# Patient Record
Sex: Male | Born: 1955 | Race: White | Hispanic: No | Marital: Married | Smoking: Never smoker
Health system: Southern US, Community
[De-identification: ages and names within clinical notes are randomized; demographics above are authoritative.]

## PROBLEM LIST (undated history)

## (undated) DIAGNOSIS — I771 Stricture of artery: Secondary | ICD-10-CM

## (undated) DIAGNOSIS — D72829 Elevated white blood cell count, unspecified: Secondary | ICD-10-CM

## (undated) DIAGNOSIS — R739 Hyperglycemia, unspecified: Secondary | ICD-10-CM

## (undated) DIAGNOSIS — M1990 Unspecified osteoarthritis, unspecified site: Secondary | ICD-10-CM

## (undated) DIAGNOSIS — E669 Obesity, unspecified: Secondary | ICD-10-CM

## (undated) DIAGNOSIS — N289 Disorder of kidney and ureter, unspecified: Secondary | ICD-10-CM

## (undated) DIAGNOSIS — R7989 Other specified abnormal findings of blood chemistry: Secondary | ICD-10-CM

## (undated) DIAGNOSIS — I10 Essential (primary) hypertension: Secondary | ICD-10-CM

## (undated) DIAGNOSIS — I219 Acute myocardial infarction, unspecified: Secondary | ICD-10-CM

## (undated) DIAGNOSIS — I499 Cardiac arrhythmia, unspecified: Secondary | ICD-10-CM

## (undated) DIAGNOSIS — E785 Hyperlipidemia, unspecified: Secondary | ICD-10-CM

## (undated) DIAGNOSIS — I2510 Atherosclerotic heart disease of native coronary artery without angina pectoris: Secondary | ICD-10-CM

## (undated) DIAGNOSIS — I779 Disorder of arteries and arterioles, unspecified: Secondary | ICD-10-CM

## (undated) HISTORY — DX: Stricture of artery: I77.1

## (undated) HISTORY — DX: Disorder of arteries and arterioles, unspecified: I77.9

## (undated) HISTORY — DX: Elevated white blood cell count, unspecified: D72.829

## (undated) HISTORY — DX: Disorder of kidney and ureter, unspecified: N28.9

## (undated) HISTORY — DX: Essential (primary) hypertension: I10

---

## 1998-02-11 ENCOUNTER — Emergency Department (HOSPITAL_COMMUNITY): Admission: EM | Admit: 1998-02-11 | Discharge: 1998-02-11 | Payer: Self-pay | Admitting: Emergency Medicine

## 2014-11-05 ENCOUNTER — Ambulatory Visit (INDEPENDENT_AMBULATORY_CARE_PROVIDER_SITE_OTHER): Payer: 59 | Admitting: Family Medicine

## 2014-11-05 ENCOUNTER — Ambulatory Visit (INDEPENDENT_AMBULATORY_CARE_PROVIDER_SITE_OTHER): Payer: 59

## 2014-11-05 VITALS — BP 162/110 | HR 74 | Temp 97.6°F | Resp 16 | Ht 66.5 in | Wt 247.6 lb

## 2014-11-05 DIAGNOSIS — M25562 Pain in left knee: Secondary | ICD-10-CM

## 2014-11-05 DIAGNOSIS — M1712 Unilateral primary osteoarthritis, left knee: Secondary | ICD-10-CM

## 2014-11-05 MED ORDER — TRAMADOL HCL 50 MG PO TABS: 50.0000 mg | ORAL_TABLET | Freq: Four times a day (QID) | ORAL | Status: DC | PRN

## 2014-11-05 MED ORDER — TRIAMCINOLONE ACETONIDE 40 MG/ML IJ SUSP
40.0000 mg | Freq: Once | INTRAMUSCULAR | Status: AC
  Administered 2014-11-05: 40 mg via INTRAMUSCULAR

## 2014-11-05 NOTE — Progress Notes (Addendum)
Subjective:    Patient ID: Donald Wood, male    DOB: April 28, 1956, 59 y.o.   MRN: [id number]  This chart was scribed for Meredith StaggersJeffrey Myking Sar, MD by Ronney LionSuzanne Le, ED Scribe. This patient was seen in room 8 and the patient's care was started at 12:29 PM.   Chief Complaint  Patient presents with  . Knee Pain    Left/ 2weeks/ Appt. with Dr. Vedia CofferBlack for knee eval     HPI  HPI Comments: Donald Wood is a 59 y.o. male who presents to the [facility].  Patient complains of chronic intermittent left knee pain since 2012, but which has worsened and become constant in the last 2 weeks, with pain in his left posterior and lateral knee radiating down his left upper calf. Patient mentions he occasionally hears "pops" in his knee; he also mentions mechanical symptoms of his left knee occasionally giving away and catching. 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 He denies a history of injections or surgeries. Patient had an XR done here before 2012 and was told it was arthritis. Extending his knee and bearing weight exacerbates the pain.  Bending or twisting doesn't affect it. Patient has taken 2 x Aleve twice a day, with no relief. Patient used to drive a tractor at work, and thinks his pain may be due to repetitive movements at work.  He has a history of hypertension a couple years ago, treated with medication. However, he lost weight and stopped taking medication. He denies a history of DVT. He also denies recent travel in a plane or car. He denies chest pain or SOB. He has NKDA.  Patient has an appointment in 10 days with Dr. Magnus IvanBlackman for the same problem.        There are no active problems to display for this patient.  History reviewed. No pertinent past medical history. History reviewed. No pertinent past surgical history. Not on File Prior to Admission medications   Not on File   History   Social  History  . Marital Status: Married    Spouse Name: N/A    Number of Children: 2  . Years of Education: N/A   Occupational History  . Not on file.   Social History Main Topics  . Smoking status: Never Smoker   . Smokeless tobacco: Not on file  . Alcohol Use: No  . Drug Use: No  . Sexual Activity:    Partners: Female   Other Topics Concern  . Not on file   Social History Narrative  . No narrative on file      Review of Systems  Respiratory: Negative for shortness of breath.   Cardiovascular: Negative for chest pain.  Musculoskeletal: Positive for arthralgias (knee pain).          Objective:   Physical Exam  Constitutional: He is oriented to person, place, and time. He appears well-developed and well-nourished. No distress.  HENT:  Head: Normocephalic and atraumatic.  Eyes: Conjunctivae and EOM are normal.  Neck: Neck supple. No tracheal deviation present.  Cardiovascular: Normal rate.   Pulmonary/Chest: Effort normal. No respiratory distress.  Musculoskeletal: He exhibits edema and tenderness.  Antalgic gait with favoring the left leg.   Left knee: 2+ effusion of left knee. Skin is intact, with no erythema or rash. Flexion to 90 degrees, full extension. Tender at the lateral joint line,  but medial joint line and patellar tendon are non-tender. Negative Clarke compression. Negative varus and and valgus maneuver. Calf is non-tender. Negative Homan's.   Calf circumference at 15 cm below the patella: equal in both calves, at 40 cm.  Neurological: He is alert and oriented to person, place, and time.  Skin: Skin is warm and dry.  Psychiatric: He has a normal mood and affect. His behavior is normal.  Nursing note and vitals reviewed.   Filed Vitals:   11/05/14 1149  BP: 162/110  Pulse: 74  Temp: 97.6 F (36.4 C)  TempSrc: Oral  Resp: 16  Height: 5' 6.5" (1.689 m)  Weight: 247 lb 9.6 oz (112.311 kg)  SpO2: 98%    [facility] reading (PRIMARY) by  Dr. Neva Seat: L knee:  degenerative changes with spurring laterally.   Risks (including but not limited to bleeding and infection, damage to surrounding/underlying tissues), benefits, and alternatives discussed for L knee superolateral injection .  Verbal consent obtained after any questions were answered. Landmarks noted, and marked as needed. Area cleansed with Betadine x3, ethyl chloride spray for topical anesthesia, followed by alcohol swab.aspirated 26cc clear yellow fluid,  Injected with 1cc Kenalog, 3cc lidocaine 1%plain.  No complications. Bandage, hinged knee brace applied.  RTC precautions discussed in regards to injection. Improved rom and less sore after injection.      Assessment & Plan:   Donald Wood is a 59 y.o. 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  Options discussed for treatment including brace, tramadol until ortho appt, (stopping NSAID with BP elevation) or trial of aspiration and injection- injected as above as no relief with NSAID, and instability sx's. Keep follow up with ortho in 10 days as may need further imaging if not improving. Sooner if worse.   Meds ordered this encounter  Medications  . triamcinolone acetonide (KENALOG-40) injection 40 mg    Sig:   . traMADol (ULTRAM) 50 MG tablet    Sig: Take 1 tablet (50 mg total) by mouth every 6 (six) hours as needed.    Dispense:  20 tablet    Refill:  0   Patient Instructions  Stop alleve, watch sodium in diet and Keep a record of your blood pressures outside of the office over the next week. If remain over 140/90 - return to discuss medication and labwork.   For your knee- you received a steroid injection and some numbing medicine after 26cc of fuild was withdrawn. Keep follow up with ortho to determine  if other imaging or treatment needed. Return to the clinic or go to the nearest emergency room if any of your symptoms worsen or new symptoms occur.  If needed for pain - tylenol OR ultram as prescribed.   Knee Pain The knee is the complex joint between your thigh and your lower leg. It is made up of bones, tendons, ligaments, and cartilage. The bones that make up the knee are:  The femur in the thigh.  The tibia and fibula in the lower leg.  The patella or kneecap riding in the groove on the lower femur. CAUSES  Knee pain is a common complaint with many causes. A few of these causes are:  Injury, such as:  A ruptured ligament or tendon injury.  Torn cartilage.  Medical conditions, such as:  Gout  Arthritis  Infections  Overuse, over training, or overdoing a physical activity. Knee pain can be minor or severe. Knee pain can accompany debilitating injury. Minor knee problems often respond well to self-care measures or get well on their own. More serious injuries may need medical intervention or even surgery. SYMPTOMS The knee is complex. Symptoms of knee problems can vary widely. Some of the problems are:  Pain with movement and weight bearing.  Swelling and tenderness.  Buckling of the knee.  Inability to straighten or extend your knee.  Your knee locks and you cannot straighten it.  Warmth and redness with pain and fever.  Deformity or dislocation of the kneecap. DIAGNOSIS  Determining what is wrong may be very straight forward such as when there is an injury. It can also be challenging because of the complexity of the knee. Tests to make a diagnosis may include:  Your caregiver taking a history and doing a physical exam.  Routine X-rays can be used to rule out other problems. X-rays will not reveal a cartilage tear. Some injuries of the knee can be diagnosed by:  Arthroscopy a surgical technique by which a small video camera is inserted through tiny incisions  on the sides of the knee. This procedure is used to examine and repair internal knee joint problems. Tiny instruments can be used during arthroscopy to repair the torn knee cartilage (meniscus).  Arthrography is a radiology technique. A contrast liquid is directly injected into the knee joint. Internal structures of the knee joint then become visible on X-ray film.  An MRI scan is a non X-ray radiology procedure in which magnetic fields and a computer produce two- or three-dimensional images of the inside of the knee. Cartilage tears are often visible using an MRI scanner. MRI scans have largely replaced arthrography in diagnosing cartilage tears of the knee.  Blood work.  Examination of the fluid that helps to lubricate the knee joint (synovial fluid). This is done by taking a sample out using a needle and a syringe. TREATMENT The treatment of knee problems depends on the cause. Some of these treatments are:  Depending on the injury, proper casting, splinting, surgery, or physical therapy care will be needed.  Give yourself adequate recovery time. Do not overuse your joints. If you begin to get sore during workout routines, back off. Slow down or do fewer repetitions.  For repetitive activities such as cycling or running, maintain your strength and nutrition.  Alternate muscle groups. For example, if you are a weight lifter, work the upper body on one day and the lower body the next.  Either tight or weak muscles do not give the proper support for your knee. Tight or weak muscles do not absorb the stress placed on the knee joint. Keep the muscles surrounding the knee strong.  Take care of mechanical problems.  If you have flat feet, orthotics or special shoes may help. See your caregiver if you need help.  Arch supports, sometimes with wedges on the inner or outer aspect of the heel, can help. These can shift pressure away from the side of the knee most bothered by osteoarthritis.  A  brace called an "unloader" brace also may be used to help ease the pressure on the most arthritic side of the knee.  If your caregiver has prescribed crutches, braces, wraps or ice, use as directed. The acronym for this is PRICE. This means protection, rest, ice, compression, and elevation.  Nonsteroidal anti-inflammatory drugs (NSAIDs), can help relieve pain. But if taken immediately after an injury, they may actually increase swelling. Take NSAIDs with food in your stomach. Stop them if you develop stomach problems. Do not take these if you have a history of ulcers, stomach pain, or bleeding from the bowel. Do not take without your caregiver's approval if you have problems with fluid retention, heart failure, or kidney problems.  For ongoing knee problems, physical therapy may be helpful.  Glucosamine and chondroitin are over-the-counter dietary supplements. Both may help relieve the pain of osteoarthritis in the knee. These medicines are different from the usual anti-inflammatory drugs. Glucosamine may decrease the rate of cartilage destruction.  Injections of a corticosteroid drug into your knee joint may help reduce the symptoms of an arthritis flare-up. They may provide pain relief that lasts a few months. You may have to wait a few months between injections. The injections do have a small increased risk of infection, water retention, and elevated blood sugar levels.  Hyaluronic acid injected into damaged joints may ease pain and provide lubrication. These injections may work by reducing inflammation. A series of shots may give relief for as long as 6 months.  Topical painkillers. Applying certain ointments to your skin may help relieve the pain and stiffness of osteoarthritis. Ask your pharmacist for suggestions. Many over the-counter products are approved for temporary relief of arthritis pain.  In some countries, doctors often prescribe topical NSAIDs for relief of chronic conditions such as  arthritis and tendinitis. A review of treatment with NSAID creams found that they worked as well as oral medications but without the serious side effects. PREVENTION  Maintain a healthy weight. Extra pounds put more strain on your joints.  Get strong, stay limber. Weak muscles are a common cause of knee injuries. Stretching is important. Include flexibility exercises in your workouts.  Be smart about exercise. If you have osteoarthritis, chronic knee pain or recurring injuries, you may need to change the way you exercise. This does not mean you have to stop being active. If your knees ache after jogging or playing basketball, consider switching to swimming, water aerobics, or other low-impact activities, at least for a few days a week. Sometimes limiting high-impact activities will provide relief.  Make sure your shoes fit well. Choose footwear that is right for your sport.  Protect your knees. Use the proper gear for knee-sensitive activities. Use kneepads when playing volleyball or laying carpet. Buckle your seat belt every time you drive. Most shattered kneecaps occur in car accidents.  Rest when you are tired. SEEK MEDICAL CARE IF:  You have knee pain that is continual and does not seem to be getting better.  SEEK IMMEDIATE MEDICAL CARE IF:  Your knee joint feels hot to the touch and you have a high fever. MAKE SURE YOU:   Understand these instructions.  Will watch your condition.  Will get help right away if you are not doing well or get worse. Document Released: 07/15/2007 Document Revised: 12/10/2011 Document Reviewed: 07/15/2007 [facility] Patient Information 2015 [place], [place]. This information is not intended to replace advice given to you by your health care provider. Make sure you discuss any questions you have with your health care provider.     I personally performed the services described in this documentation, which was scribed in my presence. The recorded information  has been reviewed and considered, and addended by me as needed.

## 2014-11-05 NOTE — Patient Instructions (Signed)
Stop alleve, watch sodium in diet and Keep a record of your blood pressures outside of the office over the next week. If remain over 140/90 - return to discuss medication and labwork.   For your knee- you received a steroid injection and some numbing medicine after 26cc of fuild was withdrawn. Keep follow up with ortho to determine if other imaging or treatment needed. Return to the clinic or go to the nearest emergency room if any of your symptoms worsen or new symptoms occur.  If needed for pain - tylenol OR ultram as prescribed.   Knee Pain The knee is the complex joint between your thigh and your lower leg. It is made up of bones, tendons, ligaments, and cartilage. The bones that make up the knee are:  The femur in the thigh.  The tibia and fibula in the lower leg.  The patella or kneecap riding in the groove on the lower femur. CAUSES  Knee pain is a common complaint with many causes. A few of these causes are:  Injury, such as:  A ruptured ligament or tendon injury.  Torn cartilage.  Medical conditions, such as:  Gout  Arthritis  Infections  Overuse, over training, or overdoing a physical activity. Knee pain can be minor or severe. Knee pain can accompany debilitating injury. Minor knee problems often respond well to self-care measures or get well on their own. More serious injuries may need medical intervention or even surgery. SYMPTOMS The knee is complex. Symptoms of knee problems can vary widely. Some of the problems are:  Pain with movement and weight bearing.  Swelling and tenderness.  Buckling of the knee.  Inability to straighten or extend your knee.  Your knee locks and you cannot straighten it.  Warmth and redness with pain and fever.  Deformity or dislocation of the kneecap. DIAGNOSIS  Determining what is wrong may be very straight forward such as when there is an injury. It can also be challenging because of the complexity of the knee. Tests to make  a diagnosis may include:  Your caregiver taking a history and doing a physical exam.  Routine X-rays can be used to rule out other problems. X-rays will not reveal a cartilage tear. Some injuries of the knee can be diagnosed by:  Arthroscopy a surgical technique by which a small video camera is inserted through tiny incisions on the sides of the knee. This procedure is used to examine and repair internal knee joint problems. Tiny instruments can be used during arthroscopy to repair the torn knee cartilage (meniscus).  Arthrography is a radiology technique. A contrast liquid is directly injected into the knee joint. Internal structures of the knee joint then become visible on X-ray film.  An MRI scan is a non X-ray radiology procedure in which magnetic fields and a computer produce two- or three-dimensional images of the inside of the knee. Cartilage tears are often visible using an MRI scanner. MRI scans have largely replaced arthrography in diagnosing cartilage tears of the knee.  Blood work.  Examination of the fluid that helps to lubricate the knee joint (synovial fluid). This is done by taking a sample out using a needle and a syringe. TREATMENT The treatment of knee problems depends on the cause. Some of these treatments are:  Depending on the injury, proper casting, splinting, surgery, or physical therapy care will be needed.  Give yourself adequate recovery time. Do not overuse your joints. If you begin to get sore during workout routines, back  off. Slow down or do fewer repetitions.  For repetitive activities such as cycling or running, maintain your strength and nutrition.  Alternate muscle groups. For example, if you are a weight lifter, work the upper body on one day and the lower body the next.  Either tight or weak muscles do not give the proper support for your knee. Tight or weak muscles do not absorb the stress placed on the knee joint. Keep the muscles surrounding the knee  strong.  Take care of mechanical problems.  If you have flat feet, orthotics or special shoes may help. See your caregiver if you need help.  Arch supports, sometimes with wedges on the inner or outer aspect of the heel, can help. These can shift pressure away from the side of the knee most bothered by osteoarthritis.  A brace called an "unloader" brace also may be used to help ease the pressure on the most arthritic side of the knee.  If your caregiver has prescribed crutches, braces, wraps or ice, use as directed. The acronym for this is PRICE. This means protection, rest, ice, compression, and elevation.  Nonsteroidal anti-inflammatory drugs (NSAIDs), can help relieve pain. But if taken immediately after an injury, they may actually increase swelling. Take NSAIDs with food in your stomach. Stop them if you develop stomach problems. Do not take these if you have a history of ulcers, stomach pain, or bleeding from the bowel. Do not take without your caregiver's approval if you have problems with fluid retention, heart failure, or kidney problems.  For ongoing knee problems, physical therapy may be helpful.  Glucosamine and chondroitin are over-the-counter dietary supplements. Both may help relieve the pain of osteoarthritis in the knee. These medicines are different from the usual anti-inflammatory drugs. Glucosamine may decrease the rate of cartilage destruction.  Injections of a corticosteroid drug into your knee joint may help reduce the symptoms of an arthritis flare-up. They may provide pain relief that lasts a few months. You may have to wait a few months between injections. The injections do have a small increased risk of infection, water retention, and elevated blood sugar levels.  Hyaluronic acid injected into damaged joints may ease pain and provide lubrication. These injections may work by reducing inflammation. A series of shots may give relief for as long as 6 months.  Topical  painkillers. Applying certain ointments to your skin may help relieve the pain and stiffness of osteoarthritis. Ask your pharmacist for suggestions. Many over the-counter products are approved for temporary relief of arthritis pain.  In some countries, doctors often prescribe topical NSAIDs for relief of chronic conditions such as arthritis and tendinitis. A review of treatment with NSAID creams found that they worked as well as oral medications but without the serious side effects. PREVENTION  Maintain a healthy weight. Extra pounds put more strain on your joints.  Get strong, stay limber. Weak muscles are a common cause of knee injuries. Stretching is important. Include flexibility exercises in your workouts.  Be smart about exercise. If you have osteoarthritis, chronic knee pain or recurring injuries, you may need to change the way you exercise. This does not mean you have to stop being active. If your knees ache after jogging or playing basketball, consider switching to swimming, water aerobics, or other low-impact activities, at least for a few days a week. Sometimes limiting high-impact activities will provide relief.  Make sure your shoes fit well. Choose footwear that is right for your sport.  Protect your knees.  Use the proper gear for knee-sensitive activities. Use kneepads when playing volleyball or laying carpet. Buckle your seat belt every time you drive. Most shattered kneecaps occur in car accidents.  Rest when you are tired. SEEK MEDICAL CARE IF:  You have knee pain that is continual and does not seem to be getting better.  SEEK IMMEDIATE MEDICAL CARE IF:  Your knee joint feels hot to the touch and you have a high fever. MAKE SURE YOU:   Understand these instructions.  Will watch your condition.  Will get help right away if you are not doing well or get worse. Document Released: 07/15/2007 Document Revised: 12/10/2011 Document Reviewed: 07/15/2007 [facility] Patient  Information 2015 [place], [place]. This information is not intended to replace advice given to you by your health care provider. Make sure you discuss any questions you have with your health care provider.

## 2014-11-16 ENCOUNTER — Other Ambulatory Visit: Payer: Self-pay | Admitting: Orthopaedic Surgery

## 2014-11-16 DIAGNOSIS — M25562 Pain in left knee: Secondary | ICD-10-CM

## 2014-11-24 ENCOUNTER — Ambulatory Visit
Admission: RE | Admit: 2014-11-24 | Discharge: 2014-11-24 | Disposition: A | Discharge status: Home / Self Care | Payer: 59 | Source: Ambulatory Visit | Attending: Orthopaedic Surgery | Admitting: Orthopaedic Surgery

## 2014-11-24 DIAGNOSIS — M25562 Pain in left knee: Secondary | ICD-10-CM

## 2014-11-29 ENCOUNTER — Other Ambulatory Visit (HOSPITAL_COMMUNITY): Payer: Self-pay | Admitting: Orthopaedic Surgery

## 2014-12-08 ENCOUNTER — Other Ambulatory Visit (HOSPITAL_COMMUNITY): Payer: Self-pay | Admitting: *Deleted

## 2014-12-08 NOTE — Patient Instructions (Addendum)
Donald Wood  12/08/2014   Your procedure is scheduled on: Friday 12/17/2014  [facility]  Entrance and follow signs to               Short Stay Center at 0810 AM.  Call this number if you have problems the morning of surgery [phone]   Remember:  Do not eat food or drink liquids :After Midnight.     Take these medicines the morning of surgery with A SIP OF WATER: Tylenol #3 if needed for pain                               You may not have any metal on your body including hair pins and              piercings  Do not wear jewelry, make-up, lotions, powders or perfumes.             Do not wear nail polish.  Do not shave  48 hours prior to surgery.              Men may shave face and neck.   Do not bring valuables to the hospital. [place] [facility] IS NOT             RESPONSIBLE   FOR VALUABLES.  Contacts, dentures or bridgework may not be worn into surgery.  Leave suitcase in the car. After surgery it may be brought to your room.     Patients discharged the day of surgery will not be allowed to drive home.  Name and phone number of your driver:  Special Instructions: N/A              Please read over the following fact sheets you were given: _____________________________________________________________________             [facility] - Pre[facility] Before surgery, you can play an important role.  Because skin is not sterile, your skin needs to be as free of germs as possible.  You can reduce the number of germs on your skin by washing with CHG (chlorahexidine gluconate) soap before surgery.  CHG is an antiseptic cleaner which kills germs and bonds with the skin to continue killing germs even after washing. Please DO NOT use if you have an allergy to CHG or antibacterial soaps.  If your skin becomes reddened/irritated stop using the CHG and inform your nurse when you arrive at Short Stay. Do not shave (including legs and underarms) for at  least 48 hours prior to the first CHG shower.  You may shave your face/neck. Please follow these instructions carefully:  1.  Shower with CHG Soap the night before surgery and the  morning of Surgery.  2.  If you choose to wash your hair, wash your hair first as usual with your  normal  shampoo.  3.  After you shampoo, rinse your hair and body thoroughly to remove the  shampoo.                           4.  Use CHG as you would any other liquid soap.  You can apply chg directly  to the skin and wash  Gently with a scrungie or clean washcloth.  5.  Apply the CHG Soap to your body ONLY FROM THE NECK DOWN.   Do not use on face/ open                           Wound or open sores. Avoid contact with eyes, ears mouth and genitals (private parts).                       Wash face,  Genitals (private parts) with your normal soap.             6.  Wash thoroughly, paying special attention to the area where your surgery  will be performed.  7.  Thoroughly rinse your body with warm water from the neck down.  8.  DO NOT shower/wash with your normal soap after using and rinsing off  the CHG Soap.                9.  Pat yourself dry with a clean towel.            10.  Wear clean pajamas.            11.  Place clean sheets on your bed the night of your first shower and do not  sleep with pets. Day of Surgery : Do not apply any lotions/deodorants the morning of surgery.  Please wear clean clothes to the hospital/surgery center.  FAILURE TO FOLLOW THESE INSTRUCTIONS MAY RESULT IN THE CANCELLATION OF YOUR SURGERY PATIENT SIGNATURE_________________________________  NURSE SIGNATURE__________________________________  ________________________________________________________________________   Donald Wood  An incentive spirometer is a tool that can help keep your lungs clear and active. This tool measures how well you are filling your lungs with each breath. Taking long deep breaths  may help reverse or decrease the chance of developing breathing (pulmonary) problems (especially infection) following:  A long period of time when you are unable to move or be active. BEFORE THE PROCEDURE   If the spirometer includes an indicator to show your best effort, your nurse or respiratory therapist will set it to a desired goal.  If possible, sit up straight or lean slightly forward. Try not to slouch.  Hold the incentive spirometer in an upright position. INSTRUCTIONS FOR USE   Sit on the edge of your bed if possible, or sit up as far as you can in bed or on a chair.  Hold the incentive spirometer in an upright position.  Breathe out normally.  Place the mouthpiece in your mouth and seal your lips tightly around it.  Breathe in slowly and as deeply as possible, raising the piston or the ball toward the top of the column.  Hold your breath for 3-5 seconds or for as long as possible. Allow the piston or ball to fall to the bottom of the column.  Remove the mouthpiece from your mouth and breathe out normally.  Rest for a few seconds and repeat Steps 1 through 7 at least 10 times every 1-2 hours when you are awake. Take your time and take a few normal breaths between deep breaths.  The spirometer may include an indicator to show your best effort. Use the indicator as a goal to work toward during each repetition.  After each set of 10 deep breaths, practice coughing to be sure your lungs are clear. If you have an incision (the cut made at the time of surgery),  support your incision when coughing by placing a pillow or rolled up towels firmly against it. Once you are able to get out of bed, walk around indoors and cough well. You may stop using the incentive spirometer when instructed by your caregiver.  RISKS AND COMPLICATIONS  Take your time so you do not get dizzy or light-headed.  If you are in pain, you may need to take or ask for pain medication before doing incentive  spirometry. It is harder to take a deep breath if you are having pain. AFTER USE  Rest and breathe slowly and easily.  It can be helpful to keep track of a log of your progress. Your caregiver can provide you with a simple table to help with this. If you are using the spirometer at home, follow these instructions: [place] IF:   You are having difficultly using the spirometer.  You have trouble using the spirometer as often as instructed.  Your pain medication is not giving enough relief while using the spirometer.  You develop fever of 100.5 F (38.1 C) or higher. SEEK IMMEDIATE MEDICAL CARE IF:   You cough up bloody sputum that had not been present before.  You develop fever of 102 F (38.9 C) or greater.  You develop worsening pain at or near the incision site. MAKE SURE YOU:   Understand these instructions.  Will watch your condition.  Will get help right away if you are not doing well or get worse. Document Released: 01/28/2007 Document Revised: 12/10/2011 Document Reviewed: 03/31/2007 ExitCare Patient Information 2014 ExitCare, [place].   ________________________________________________________________________  WHAT IS A BLOOD TRANSFUSION? Blood Transfusion Information  A transfusion is the replacement of blood or some of its parts. Blood is made up of multiple cells which provide different functions.  Red blood cells carry oxygen and are used for blood loss replacement.  White blood cells fight against infection.  Platelets control bleeding.  Plasma helps clot blood.  Other blood products are available for specialized needs, such as hemophilia or other clotting disorders. BEFORE THE TRANSFUSION  Who gives blood for transfusions?   Healthy volunteers who are fully evaluated to make sure their blood is safe. This is blood bank blood. Transfusion therapy is the safest it has ever been in the practice of medicine. Before blood is taken from a donor, a  complete history is taken to make sure that person has no history of diseases nor engages in risky social behavior (examples are intravenous drug use or sexual activity with multiple partners). The donor's travel history is screened to minimize risk of transmitting infections, such as malaria. The donated blood is tested for signs of infectious diseases, such as HIV and hepatitis. The blood is then tested to be sure it is compatible with you in order to minimize the chance of a transfusion reaction. If you or a relative donates blood, this is often done in anticipation of surgery and is not appropriate for emergency situations. It takes many days to process the donated blood. RISKS AND COMPLICATIONS Although transfusion therapy is very safe and saves many lives, the main dangers of transfusion include:   Getting an infectious disease.  Developing a transfusion reaction. This is an allergic reaction to something in the blood you were given. Every precaution is taken to prevent this. The decision to have a blood transfusion has been considered carefully by your caregiver before blood is given. Blood is not given unless the benefits outweigh the risks. AFTER THE TRANSFUSION  Right after receiving a blood transfusion, you will usually feel much better and more energetic. This is especially true if your red blood cells have gotten low (anemic). The transfusion raises the level of the red blood cells which carry oxygen, and this usually causes an energy increase.  The nurse administering the transfusion will monitor you carefully for complications. HOME CARE INSTRUCTIONS  No special instructions are needed after a transfusion. You may find your energy is better. Speak with your caregiver about any limitations on activity for underlying diseases you may have. SEEK MEDICAL CARE IF:   Your condition is not improving after your transfusion.  You develop redness or irritation at the intravenous (IV)  site. SEEK IMMEDIATE MEDICAL CARE IF:  Any of the following symptoms occur over the next 12 hours:  Shaking chills.  You have a temperature by mouth above 102 F (38.9 C), not controlled by medicine.  Chest, back, or muscle pain.  People around you feel you are not acting correctly or are confused.  Shortness of breath or difficulty breathing.  Dizziness and fainting.  You get a rash or develop hives.  You have a decrease in urine output.  Your urine turns a dark color or changes to pink, red, or brown. Any of the following symptoms occur over the next 10 days:  You have a temperature by mouth above 102 F (38.9 C), not controlled by medicine.  Shortness of breath.  Weakness after normal activity.  The white part of the eye turns yellow (jaundice).  You have a decrease in the amount of urine or are urinating less often.  Your urine turns a dark color or changes to pink, red, or brown. Document Released: 09/14/2000 Document Revised: 12/10/2011 Document Reviewed: 05/03/2008 [facility] Patient Information 2014 [place], [place].  _______________________________________________________________________

## 2014-12-09 ENCOUNTER — Encounter (HOSPITAL_COMMUNITY): Payer: Self-pay

## 2014-12-09 ENCOUNTER — Encounter (HOSPITAL_COMMUNITY)
Admission: RE | Admit: 2014-12-09 | Discharge: 2014-12-09 | Disposition: A | Discharge status: Home / Self Care | Payer: 59 | Source: Ambulatory Visit | Attending: Orthopaedic Surgery | Admitting: Orthopaedic Surgery

## 2014-12-09 DIAGNOSIS — Z01812 Encounter for preprocedural laboratory examination: Secondary | ICD-10-CM | POA: Diagnosis present

## 2014-12-09 DIAGNOSIS — M1712 Unilateral primary osteoarthritis, left knee: Secondary | ICD-10-CM | POA: Insufficient documentation

## 2014-12-09 HISTORY — DX: Unspecified osteoarthritis, unspecified site: M19.90

## 2014-12-09 LAB — BASIC METABOLIC PANEL
Anion gap: 7 (ref 5–15)
BUN: 15 mg/dL (ref 6–23)
CALCIUM: 9.3 mg/dL (ref 8.4–10.5)
CO2: 25 mmol/L (ref 19–32)
Chloride: 106 mmol/L (ref 96–112)
Creatinine, Ser: 0.85 mg/dL (ref 0.50–1.35)
GFR calc Af Amer: >90 mL/min (ref >90)
GFR calc non Af Amer: >90 mL/min (ref >90)
GLUCOSE: 105 mg/dL — AB (ref 70–99)
Potassium: 4.3 mmol/L (ref 3.5–5.1)
Sodium: 138 mmol/L (ref 135–145)

## 2014-12-09 LAB — SURGICAL PCR SCREEN
MRSA, PCR: NEGATIVE
STAPHYLOCOCCUS AUREUS: POSITIVE — AB

## 2014-12-09 LAB — CBC
HEMATOCRIT: 45.7 % (ref 39.0–52.0)
Hemoglobin: 15.5 g/dL (ref 13.0–17.0)
MCH: 31.1 pg (ref 26.0–34.0)
MCHC: 33.9 g/dL (ref 30.0–36.0)
MCV: 91.8 fL (ref 78.0–100.0)
Platelets: 277 10*3/uL (ref 150–400)
RBC: 4.98 MIL/uL (ref 4.22–5.81)
RDW: 13.3 % (ref 11.5–15.5)
WBC: 5.8 10*3/uL (ref 4.0–10.5)

## 2014-12-09 LAB — PROTIME-INR
INR: 1.03 (ref 0.00–1.49)
Prothrombin Time: 13.6 seconds (ref 11.6–15.2)

## 2014-12-09 LAB — ABO/RH: ABO/RH(D): A POS

## 2014-12-09 LAB — APTT: aPTT: 28 seconds (ref 24–37)

## 2014-12-09 NOTE — Progress Notes (Signed)
   12/09/14 1146  OBSTRUCTIVE SLEEP APNEA  Have you ever been diagnosed with sleep apnea through a sleep study? No  Do you snore loudly (loud enough to be heard through closed doors)?  0  Do you often feel tired, fatigued, or sleepy during the daytime? 0  Has anyone observed you stop breathing during your sleep? 0  Do you have, or are you being treated for high blood pressure? 0  BMI more than 35 kg/m2? 1  Age over 59 years old? 1  Neck circumference greater than 40 cm/16 inches? 1  Gender: 1  Obstructive Sleep Apnea Score 4

## 2014-12-17 ENCOUNTER — Inpatient Hospital Stay (HOSPITAL_COMMUNITY): Payer: 59

## 2014-12-17 ENCOUNTER — Encounter (HOSPITAL_COMMUNITY)
Admission: RE | Disposition: A | Discharge status: Home Health | Payer: Self-pay | Source: Ambulatory Visit | Attending: Orthopaedic Surgery

## 2014-12-17 ENCOUNTER — Inpatient Hospital Stay (HOSPITAL_COMMUNITY): Payer: 59 | Admitting: Anesthesiology

## 2014-12-17 ENCOUNTER — Inpatient Hospital Stay (HOSPITAL_COMMUNITY)
Admission: RE | Admit: 2014-12-17 | Discharge: 2014-12-19 | DRG: 470 | Disposition: A | Discharge status: Home Health | Payer: 59 | Source: Ambulatory Visit | Attending: Orthopaedic Surgery | Admitting: Orthopaedic Surgery

## 2014-12-17 ENCOUNTER — Encounter (HOSPITAL_COMMUNITY): Payer: Self-pay | Admitting: *Deleted

## 2014-12-17 DIAGNOSIS — Z96652 Presence of left artificial knee joint: Secondary | ICD-10-CM

## 2014-12-17 DIAGNOSIS — M25562 Pain in left knee: Secondary | ICD-10-CM | POA: Diagnosis present

## 2014-12-17 DIAGNOSIS — Z01812 Encounter for preprocedural laboratory examination: Secondary | ICD-10-CM

## 2014-12-17 DIAGNOSIS — Z6839 Body mass index (BMI) 39.0-39.9, adult: Secondary | ICD-10-CM | POA: Diagnosis not present

## 2014-12-17 DIAGNOSIS — M1712 Unilateral primary osteoarthritis, left knee: Secondary | ICD-10-CM

## 2014-12-17 HISTORY — PX: TOTAL KNEE ARTHROPLASTY: SHX125

## 2014-12-17 LAB — TYPE AND SCREEN
ABO/RH(D): A POS
Antibody Screen: NEGATIVE

## 2014-12-17 SURGERY — ARTHROPLASTY, KNEE, TOTAL
Anesthesia: General | Site: Knee | Laterality: Left

## 2014-12-17 MED ORDER — LIDOCAINE HCL (CARDIAC) 20 MG/ML IV SOLN
INTRAVENOUS | Status: DC | PRN
  Administered 2014-12-17: 100 mg via INTRAVENOUS

## 2014-12-17 MED ORDER — FENTANYL CITRATE 0.05 MG/ML IJ SOLN
INTRAMUSCULAR | Status: DC | PRN
  Administered 2014-12-17 (×2): 100 ug via INTRAVENOUS

## 2014-12-17 MED ORDER — BUPIVACAINE LIPOSOME 1.3 % IJ SUSP
20.0000 mL | Freq: Once | INTRAMUSCULAR | Status: AC
  Administered 2014-12-17: 20 mL
  Filled 2014-12-17: qty 20

## 2014-12-17 MED ORDER — DIPHENHYDRAMINE HCL 12.5 MG/5ML PO ELIX
12.5000 mg | ORAL_SOLUTION | ORAL | Status: DC | PRN
  Administered 2014-12-18: 25 mg via ORAL
  Filled 2014-12-17: qty 10

## 2014-12-17 MED ORDER — POLYETHYLENE GLYCOL 3350 17 G PO PACK: 17.0000 g | PACK | Freq: Every day | ORAL | Status: DC | PRN

## 2014-12-17 MED ORDER — ZOLPIDEM TARTRATE 5 MG PO TABS: 5.0000 mg | ORAL_TABLET | Freq: Every evening | ORAL | Status: DC | PRN

## 2014-12-17 MED ORDER — SODIUM CHLORIDE 0.9 % IJ SOLN
INTRAMUSCULAR | Status: DC | PRN
  Administered 2014-12-17: 40 mL

## 2014-12-17 MED ORDER — LACTATED RINGERS IV SOLN
INTRAVENOUS | Status: DC
  Administered 2014-12-17: 1000 mL via INTRAVENOUS
  Administered 2014-12-17: 11:00:00 via INTRAVENOUS

## 2014-12-17 MED ORDER — HYDROMORPHONE HCL 2 MG/ML IJ SOLN
INTRAMUSCULAR | Status: AC
  Filled 2014-12-17: qty 1

## 2014-12-17 MED ORDER — MIDAZOLAM HCL 2 MG/2ML IJ SOLN
INTRAMUSCULAR | Status: AC
  Filled 2014-12-17: qty 2

## 2014-12-17 MED ORDER — FENTANYL CITRATE 0.05 MG/ML IJ SOLN
INTRAMUSCULAR | Status: AC
  Filled 2014-12-17: qty 2

## 2014-12-17 MED ORDER — DEXAMETHASONE SODIUM PHOSPHATE 10 MG/ML IJ SOLN
INTRAMUSCULAR | Status: DC | PRN
  Administered 2014-12-17: 10 mg via INTRAVENOUS

## 2014-12-17 MED ORDER — METHOCARBAMOL 1000 MG/10ML IJ SOLN
500.0000 mg | Freq: Four times a day (QID) | INTRAVENOUS | Status: DC | PRN
  Administered 2014-12-17: 500 mg via INTRAVENOUS
  Filled 2014-12-17 (×2): qty 5

## 2014-12-17 MED ORDER — CEFAZOLIN SODIUM 1-5 GM-% IV SOLN
1.0000 g | Freq: Four times a day (QID) | INTRAVENOUS | Status: AC
  Administered 2014-12-17 (×2): 1 g via INTRAVENOUS
  Filled 2014-12-17 (×2): qty 50

## 2014-12-17 MED ORDER — CEFAZOLIN SODIUM-DEXTROSE 2-3 GM-% IV SOLR
INTRAVENOUS | Status: AC
  Filled 2014-12-17: qty 50

## 2014-12-17 MED ORDER — PROPOFOL 10 MG/ML IV BOLUS
INTRAVENOUS | Status: DC | PRN
  Administered 2014-12-17: 200 mg via INTRAVENOUS

## 2014-12-17 MED ORDER — PROPOFOL 10 MG/ML IV BOLUS
INTRAVENOUS | Status: AC
  Filled 2014-12-17: qty 20

## 2014-12-17 MED ORDER — PROMETHAZINE HCL 25 MG/ML IJ SOLN: 6.2500 mg | INTRAMUSCULAR | Status: DC | PRN

## 2014-12-17 MED ORDER — SODIUM CHLORIDE 0.9 % IJ SOLN
INTRAMUSCULAR | Status: AC
  Filled 2014-12-17: qty 50

## 2014-12-17 MED ORDER — ACETAMINOPHEN 650 MG RE SUPP: 650.0000 mg | Freq: Four times a day (QID) | RECTAL | Status: DC | PRN

## 2014-12-17 MED ORDER — ONDANSETRON HCL 4 MG/2ML IJ SOLN
INTRAMUSCULAR | Status: DC | PRN
Start: 2014-12-17 — End: 2014-12-17
  Administered 2014-12-17: 4 mg via INTRAVENOUS

## 2014-12-17 MED ORDER — GLYCOPYRROLATE 0.2 MG/ML IJ SOLN
INTRAMUSCULAR | Status: DC | PRN
  Administered 2014-12-17: 0.6 mg via INTRAVENOUS

## 2014-12-17 MED ORDER — CEFAZOLIN SODIUM-DEXTROSE 2-3 GM-% IV SOLR
2.0000 g | INTRAVENOUS | Status: AC
  Administered 2014-12-17: 2 g via INTRAVENOUS

## 2014-12-17 MED ORDER — ROCURONIUM BROMIDE 100 MG/10ML IV SOLN
INTRAVENOUS | Status: AC
  Filled 2014-12-17: qty 1

## 2014-12-17 MED ORDER — ONDANSETRON HCL 4 MG/2ML IJ SOLN
4.0000 mg | Freq: Four times a day (QID) | INTRAMUSCULAR | Status: DC | PRN
  Administered 2014-12-17 (×2): 4 mg via INTRAVENOUS
  Filled 2014-12-17 (×2): qty 2

## 2014-12-17 MED ORDER — PHENOL 1.4 % MT LIQD: 1.0000 | OROMUCOSAL | Status: DC | PRN

## 2014-12-17 MED ORDER — METOCLOPRAMIDE HCL 10 MG PO TABS: 5.0000 mg | ORAL_TABLET | Freq: Three times a day (TID) | ORAL | Status: DC | PRN

## 2014-12-17 MED ORDER — ACETAMINOPHEN 325 MG PO TABS: 650.0000 mg | ORAL_TABLET | Freq: Four times a day (QID) | ORAL | Status: DC | PRN

## 2014-12-17 MED ORDER — RIVAROXABAN 10 MG PO TABS
10.0000 mg | ORAL_TABLET | Freq: Every day | ORAL | Status: DC
  Administered 2014-12-18 – 2014-12-19 (×2): 10 mg via ORAL
  Filled 2014-12-17 (×3): qty 1

## 2014-12-17 MED ORDER — SUCCINYLCHOLINE CHLORIDE 20 MG/ML IJ SOLN
INTRAMUSCULAR | Status: DC | PRN
  Administered 2014-12-17: 100 mg via INTRAVENOUS

## 2014-12-17 MED ORDER — DOCUSATE SODIUM 100 MG PO CAPS
100.0000 mg | ORAL_CAPSULE | Freq: Two times a day (BID) | ORAL | Status: DC
  Administered 2014-12-17 – 2014-12-19 (×4): 100 mg via ORAL

## 2014-12-17 MED ORDER — HYDROMORPHONE HCL 1 MG/ML IJ SOLN
INTRAMUSCULAR | Status: AC
  Administered 2014-12-17: 1 mg via INTRAVENOUS
  Filled 2014-12-17: qty 1

## 2014-12-17 MED ORDER — HYDROMORPHONE HCL 1 MG/ML IJ SOLN
0.2500 mg | INTRAMUSCULAR | Status: DC | PRN
  Administered 2014-12-17: 0.5 mg via INTRAVENOUS
  Administered 2014-12-17: 0.25 mg via INTRAVENOUS
  Administered 2014-12-17: 0.5 mg via INTRAVENOUS
  Administered 2014-12-17: 0.25 mg via INTRAVENOUS

## 2014-12-17 MED ORDER — HYDROMORPHONE HCL 1 MG/ML IJ SOLN
1.0000 mg | INTRAMUSCULAR | Status: DC | PRN
  Administered 2014-12-17 – 2014-12-18 (×3): 1 mg via INTRAVENOUS
  Filled 2014-12-17 (×3): qty 1

## 2014-12-17 MED ORDER — OXYCODONE HCL 5 MG PO TABS
5.0000 mg | ORAL_TABLET | ORAL | Status: DC | PRN
  Administered 2014-12-17 (×2): 15 mg via ORAL
  Administered 2014-12-17: 5 mg via ORAL
  Administered 2014-12-18 (×3): 15 mg via ORAL
  Administered 2014-12-18: 10 mg via ORAL
  Administered 2014-12-18 – 2014-12-19 (×3): 15 mg via ORAL
  Filled 2014-12-17 (×8): qty 3
  Filled 2014-12-17: qty 2
  Filled 2014-12-17: qty 3

## 2014-12-17 MED ORDER — HYDROMORPHONE HCL 1 MG/ML IJ SOLN
INTRAMUSCULAR | Status: AC
  Filled 2014-12-17: qty 1

## 2014-12-17 MED ORDER — HYDROMORPHONE HCL 1 MG/ML IJ SOLN
INTRAMUSCULAR | Status: DC | PRN
  Administered 2014-12-17 (×2): 0.5 mg via INTRAVENOUS
  Administered 2014-12-17: 1 mg via INTRAVENOUS

## 2014-12-17 MED ORDER — ONDANSETRON HCL 4 MG PO TABS: 4.0000 mg | ORAL_TABLET | Freq: Four times a day (QID) | ORAL | Status: DC | PRN

## 2014-12-17 MED ORDER — TRANEXAMIC ACID 100 MG/ML IV SOLN
1000.0000 mg | INTRAVENOUS | Status: AC
  Administered 2014-12-17: 1000 mg via INTRAVENOUS
  Filled 2014-12-17: qty 10

## 2014-12-17 MED ORDER — METOCLOPRAMIDE HCL 5 MG/ML IJ SOLN
5.0000 mg | Freq: Three times a day (TID) | INTRAMUSCULAR | Status: DC | PRN
  Administered 2014-12-17: 10 mg via INTRAVENOUS
  Filled 2014-12-17 (×3): qty 2

## 2014-12-17 MED ORDER — MEPERIDINE HCL 50 MG/ML IJ SOLN: 6.2500 mg | INTRAMUSCULAR | Status: DC | PRN

## 2014-12-17 MED ORDER — 0.9 % SODIUM CHLORIDE (POUR BTL) OPTIME
TOPICAL | Status: DC | PRN
  Administered 2014-12-17: 1000 mL

## 2014-12-17 MED ORDER — MIDAZOLAM HCL 5 MG/5ML IJ SOLN
INTRAMUSCULAR | Status: DC | PRN
  Administered 2014-12-17: 2 mg via INTRAVENOUS

## 2014-12-17 MED ORDER — SODIUM CHLORIDE 0.9 % IV SOLN
INTRAVENOUS | Status: DC
  Administered 2014-12-17 – 2014-12-18 (×3): via INTRAVENOUS

## 2014-12-17 MED ORDER — METHOCARBAMOL 500 MG PO TABS
500.0000 mg | ORAL_TABLET | Freq: Four times a day (QID) | ORAL | Status: DC | PRN
  Administered 2014-12-18 – 2014-12-19 (×5): 500 mg via ORAL
  Filled 2014-12-17 (×6): qty 1

## 2014-12-17 MED ORDER — NEOSTIGMINE METHYLSULFATE 10 MG/10ML IV SOLN
INTRAVENOUS | Status: DC | PRN
  Administered 2014-12-17: 5 mg via INTRAVENOUS

## 2014-12-17 MED ORDER — LIDOCAINE HCL (CARDIAC) 20 MG/ML IV SOLN
INTRAVENOUS | Status: AC
  Filled 2014-12-17: qty 5

## 2014-12-17 MED ORDER — ROCURONIUM BROMIDE 100 MG/10ML IV SOLN
INTRAVENOUS | Status: DC | PRN
  Administered 2014-12-17: 40 mg via INTRAVENOUS

## 2014-12-17 MED ORDER — TAMSULOSIN HCL 0.4 MG PO CAPS
0.4000 mg | ORAL_CAPSULE | Freq: Every day | ORAL | Status: DC
  Administered 2014-12-17 – 2014-12-19 (×3): 0.4 mg via ORAL
  Filled 2014-12-17 (×3): qty 1

## 2014-12-17 MED ORDER — MENTHOL 3 MG MT LOZG: 1.0000 | LOZENGE | OROMUCOSAL | Status: DC | PRN

## 2014-12-17 MED ORDER — SODIUM CHLORIDE 0.9 % IR SOLN
Status: DC | PRN
  Administered 2014-12-17: 2000 mL

## 2014-12-17 SURGICAL SUPPLY — 61 items
APL SKNCLS STERI-STRIP NONHPOA (GAUZE/BANDAGES/DRESSINGS) ×1
BAG DECANTER FOR FLEXI CONT (MISCELLANEOUS) ×2 IMPLANT
BAG SPEC THK2 15X12 ZIP CLS (MISCELLANEOUS)
BAG ZIPLOCK 12X15 (MISCELLANEOUS) IMPLANT
BANDAGE ELASTIC 6 VELCRO ST LF (GAUZE/BANDAGES/DRESSINGS) ×2 IMPLANT
BANDAGE ESMARK 6X9 LF (GAUZE/BANDAGES/DRESSINGS) ×1 IMPLANT
BENZOIN TINCTURE PRP APPL 2/3 (GAUZE/BANDAGES/DRESSINGS) ×1 IMPLANT
BLADE SAG 13.0X1.37X90 (BLADE) IMPLANT
BLADE SAG 18X100X1.27 (BLADE) IMPLANT
BLADE SAGITTAL 25.0X1.37X90 (BLADE) IMPLANT
BNDG CMPR 9X6 STRL LF SNTH (GAUZE/BANDAGES/DRESSINGS) ×1
BNDG ESMARK 6X9 LF (GAUZE/BANDAGES/DRESSINGS) ×2
BOWL SMART MIX CTS (DISPOSABLE) ×2 IMPLANT
CAPT KNEE TOTAL 3 ×1 IMPLANT
CEMENT BONE 1-PACK (Cement) ×4 IMPLANT
CUFF TOURN SGL QUICK 34 (TOURNIQUET CUFF) ×2
CUFF TRNQT CYL 34X4X40X1 (TOURNIQUET CUFF) ×1 IMPLANT
DRAPE EXTREMITY T 121X128X90 (DRAPE) ×2 IMPLANT
DRAPE POUCH INSTRU U-SHP 10X18 (DRAPES) ×2 IMPLANT
DRAPE SHEET LG 3/4 BI-LAMINATE (DRAPES) IMPLANT
DRAPE U-SHAPE 47X51 STRL (DRAPES) ×2 IMPLANT
DRSG PAD ABDOMINAL 8X10 ST (GAUZE/BANDAGES/DRESSINGS) ×2 IMPLANT
DURAPREP 26ML APPLICATOR (WOUND CARE) ×2 IMPLANT
ELECT REM PT RETURN 9FT ADLT (ELECTROSURGICAL) ×2
ELECTRODE REM PT RTRN 9FT ADLT (ELECTROSURGICAL) ×1 IMPLANT
FACESHIELD WRAPAROUND (MASK) ×10 IMPLANT
FACESHIELD WRAPAROUND OR TEAM (MASK) ×5 IMPLANT
GAUZE SPONGE 4X4 12PLY STRL (GAUZE/BANDAGES/DRESSINGS) ×2 IMPLANT
GAUZE XEROFORM 1X8 LF (GAUZE/BANDAGES/DRESSINGS) IMPLANT
GLOVE BIO SURGEON STRL SZ7.5 (GLOVE) ×2 IMPLANT
GLOVE BIOGEL PI IND STRL 8 (GLOVE) ×2 IMPLANT
GLOVE BIOGEL PI INDICATOR 8 (GLOVE) ×2
GLOVE ECLIPSE 8.0 STRL XLNG CF (GLOVE) ×2 IMPLANT
GOWN STRL REUS W/TWL XL LVL3 (GOWN DISPOSABLE) ×4 IMPLANT
HANDPIECE INTERPULSE COAX TIP (DISPOSABLE) ×2
IMMOBILIZER KNEE 20 (SOFTGOODS) ×2
IMMOBILIZER KNEE 20 THIGH 36 (SOFTGOODS) ×1 IMPLANT
KIT BASIN OR (CUSTOM PROCEDURE TRAY) ×2 IMPLANT
NS IRRIG 1000ML POUR BTL (IV SOLUTION) ×2 IMPLANT
PACK TOTAL JOINT (CUSTOM PROCEDURE TRAY) ×2 IMPLANT
PADDING CAST COTTON 6X4 STRL (CAST SUPPLIES) ×3 IMPLANT
PEN SKIN MARKING BROAD (MISCELLANEOUS) ×2 IMPLANT
POSITIONER SURGICAL ARM (MISCELLANEOUS) ×2 IMPLANT
SET HNDPC FAN SPRY TIP SCT (DISPOSABLE) ×1 IMPLANT
SET PAD KNEE POSITIONER (MISCELLANEOUS) ×2 IMPLANT
STAPLER VISISTAT 35W (STAPLE) IMPLANT
STRIP CLOSURE SKIN 1/2X4 (GAUZE/BANDAGES/DRESSINGS) ×1 IMPLANT
SUCTION FRAZIER 12FR DISP (SUCTIONS) ×2 IMPLANT
SUT MNCRL AB 4-0 PS2 18 (SUTURE) IMPLANT
SUT VIC AB 0 CT1 27 (SUTURE) ×4
SUT VIC AB 0 CT1 27XBRD ANTBC (SUTURE) ×1 IMPLANT
SUT VIC AB 1 CT1 27 (SUTURE) ×6
SUT VIC AB 1 CT1 27XBRD ANTBC (SUTURE) ×2 IMPLANT
SUT VIC AB 2-0 CT1 27 (SUTURE) ×4
SUT VIC AB 2-0 CT1 TAPERPNT 27 (SUTURE) ×2 IMPLANT
TOWEL OR 17X26 10 PK STRL BLUE (TOWEL DISPOSABLE) ×2 IMPLANT
TOWEL OR NON WOVEN STRL DISP B (DISPOSABLE) IMPLANT
TRAY FOLEY CATH 14FRSI W/METER (CATHETERS) ×2 IMPLANT
WATER STERILE IRR 1500ML POUR (IV SOLUTION) ×2 IMPLANT
WRAP KNEE MAXI GEL POST OP (GAUZE/BANDAGES/DRESSINGS) ×2 IMPLANT
YANKAUER SUCT BULB TIP 10FT TU (MISCELLANEOUS) ×2 IMPLANT

## 2014-12-17 NOTE — Brief Op Note (Signed)
12/17/2014  12:00 PM  PATIENT:  Donald Wood  59 y.o. male  PRE-OPERATIVE DIAGNOSIS:  left knee osteoarthritis  POST-OPERATIVE DIAGNOSIS:  left knee osteoarthritis  PROCEDURE:  Procedure(s): LEFT TOTAL KNEE ARTHROPLASTY (Left)  SURGEON:  Surgeon(s) and Role:    * Kathryne Hitchhristopher Y Oshen Wlodarczyk, MD - Primary  PHYSICIAN ASSISTANT: Rexene EdisonGil Clark, PA-C  ANESTHESIA:   local and general  EBL:  Total I/O In: 1000 [I.V.:1000] Out: 100 [Blood:100]  BLOOD ADMINISTERED:none  DRAINS: none   LOCAL MEDICATIONS USED:  OTHER Experil   SPECIMEN:  No Specimen  DISPOSITION OF SPECIMEN:  N/A  COUNTS:  YES  TOURNIQUET:   Total Tourniquet Time Documented: Thigh (Left) - 61 minutes Total: Thigh (Left) - 61 minutes   DICTATION: .Other Dictation: Dictation Number [id number]  PLAN OF CARE: Admit to inpatient   PATIENT DISPOSITION:  PACU - hemodynamically stable.   Delay start of Pharmacological VTE agent (>24hrs) due to surgical blood loss or risk of bleeding: no

## 2014-12-17 NOTE — Anesthesia Postprocedure Evaluation (Signed)
Anesthesia Post Note  Patient: Donald Wood  Procedure(s) Performed: Procedure(s) (LRB): LEFT TOTAL KNEE ARTHROPLASTY (Left)  Anesthesia type: General  Patient location: PACU  Post pain: Pain level controlled  Post assessment: Post-op Vital signs reviewed  Last Vitals:  Filed Vitals:   12/17/14 1300  BP: 169/89  Pulse: 45  Temp:   Resp: 16    Post vital signs: Reviewed  Level of consciousness: sedated  Complications: No apparent anesthesia complications

## 2014-12-17 NOTE — Anesthesia Preprocedure Evaluation (Signed)
Anesthesia Evaluation  Patient identified by MRN, date of birth, ID band Patient awake    Reviewed: Allergy & Precautions, H&P , NPO status , Patient's Chart, lab work & pertinent test results  Airway Mallampati: II  TM Distance: >3 FB Neck ROM: full    Dental no notable dental hx.    Pulmonary neg pulmonary ROS,    Pulmonary exam normal       Cardiovascular Exercise Tolerance: Good negative cardio ROS      Neuro/Psych negative neurological ROS  negative psych ROS   GI/Hepatic negative GI ROS, Neg liver ROS,   Endo/Other  Morbid obesity  Renal/GU negative Renal ROS  negative genitourinary   Musculoskeletal   Abdominal Normal abdominal exam  (+)   Peds  Hematology negative hematology ROS (+)   Anesthesia Other Findings   Reproductive/Obstetrics negative OB ROS                             Anesthesia Physical Anesthesia Plan  ASA: III  Anesthesia Plan: General   Post-op Pain Management:    Induction: Intravenous  Airway Management Planned: Oral ETT  Additional Equipment:   Intra-op Plan:   Post-operative Plan: Extubation in OR  Informed Consent: I have reviewed the patients History and Physical, chart, labs and discussed the procedure including the risks, benefits and alternatives for the proposed anesthesia with the patient or authorized representative who has indicated his/her understanding and acceptance.   Dental Advisory Given  Plan Discussed with: CRNA and Surgeon  Anesthesia Plan Comments:         Anesthesia Quick Evaluation

## 2014-12-17 NOTE — Progress Notes (Signed)
PT Cancellation Note  Patient Details Name: Donald Wood MRN: [id number] DOB: [birth year]   Cancelled Treatment:    Reason Eval/Treat Not Completed: Medical issues which prohibited therapy;Pain limiting ability to participate, patient has had increased pain and BP are increased. Will evaluate in AM. Blanchard KelchKaren Winna Golla PT [phone]    Rada HayHill, Masaji Billups Elizabeth 12/17/2014, 4:22 PM

## 2014-12-17 NOTE — H&P (Signed)
TOTAL KNEE ADMISSION H&P  Patient is being admitted for left total knee arthroplasty.  Subjective:  Chief Complaint:left knee pain.  HPI: Donald DestineJames R Remsen, 59 y.o. male, has a history of pain and functional disability in the left knee due to arthritis and has failed non-surgical conservative treatments for greater than 12 weeks to includeNSAID's and/or analgesics, corticosteriod injections, weight reduction as appropriate and activity modification.  Onset of symptoms was gradual, starting 3 years ago with gradually worsening course since that time. The patient noted no past surgery on the left knee(s).  Patient currently rates pain in the left knee(s) at 10 out of 10 with activity. Patient has night pain, worsening of pain with activity and weight bearing, pain that interferes with activities of daily living, pain with passive range of motion, crepitus and joint swelling.  Patient has evidence of subchondral sclerosis, periarticular osteophytes and joint space narrowing by imaging studies. There is no active infection.  Patient Active Problem List   Diagnosis Date Noted  . Osteoarthritis of left knee 12/17/2014   Past Medical History  Diagnosis Date  . Arthritis     No past surgical history on file.  No prescriptions prior to admission   No Known Allergies  History  Substance Use Topics  . Smoking status: Never Smoker   . Smokeless tobacco: Not on file  . Alcohol Use: 0.0 oz/week    0 Standard drinks or equivalent per week     Comment: occassionally    No family history on file.   Review of Systems  Musculoskeletal: Positive for joint pain.  All other systems reviewed and are negative.   Objective:  Physical Exam  Constitutional: He is oriented to person, place, and time. He appears well-developed and well-nourished.  HENT:  Head: Normocephalic and atraumatic.  Eyes: EOM are normal. Pupils are equal, round, and reactive to light.  Neck: Normal range of motion. Neck supple.   Cardiovascular: Normal rate and regular rhythm.   Respiratory: Effort normal and breath sounds normal.  GI: Soft. Bowel sounds are normal.  Musculoskeletal:       Left knee: He exhibits decreased range of motion, swelling and abnormal alignment. Tenderness found. Medial joint line and lateral joint line tenderness noted.  Neurological: He is alert and oriented to person, place, and time.  Skin: Skin is warm and dry.  Psychiatric: He has a normal mood and affect.    Vital signs in last 24 hours:    Labs:   Estimated body mass index is 39.37 kg/(m^2) as calculated from the following:   Height as of 11/05/14: 5' 6.5" (1.689 m).   Weight as of 11/05/14: 112.311 kg (247 lb 9.6 oz).   Imaging Review Plain radiographs demonstrate severe degenerative joint disease of the left knee(s). The overall alignment ismild varus. The bone quality appears to be excellent for age and reported activity level.  Assessment/Plan:  End stage arthritis, left knee   The patient history, physical examination, clinical judgment of the provider and imaging studies are consistent with end stage degenerative joint disease of the left knee(s) and total knee arthroplasty is deemed medically necessary. The treatment options including medical management, injection therapy arthroscopy and arthroplasty were discussed at length. The risks and benefits of total knee arthroplasty were presented and reviewed. The risks due to aseptic loosening, infection, stiffness, patella tracking problems, thromboembolic complications and other imponderables were discussed. The patient acknowledged the explanation, agreed to proceed with the plan and consent was signed. Patient is being  admitted for inpatient treatment for surgery, pain control, PT, OT, prophylactic antibiotics, VTE prophylaxis, progressive ambulation and ADL's and discharge planning. The patient is planning to be discharged home with home health services

## 2014-12-17 NOTE — Transfer of Care (Signed)
Immediate Anesthesia Transfer of Care Note  Patient: Donald DestineJames R Abad  Procedure(s) Performed: Procedure(s): LEFT TOTAL KNEE ARTHROPLASTY (Left)  Patient Location: PACU  Anesthesia Type:General  Level of Consciousness: sedated  Airway & Oxygen Therapy: Patient Spontanous Breathing and Patient connected to face mask oxygen  Post-op Assessment: Report given to RN and Post -op Vital signs reviewed and stable  Post vital signs: Reviewed and stable  Last Vitals:  Filed Vitals:   12/17/14 0803  BP: 169/75  Pulse: 48  Temp:   Resp:     Complications: No apparent anesthesia complications

## 2014-12-18 LAB — CBC
HCT: 39.4 % (ref 39.0–52.0)
HEMOGLOBIN: 12.9 g/dL — AB (ref 13.0–17.0)
MCH: 30.4 pg (ref 26.0–34.0)
MCHC: 32.7 g/dL (ref 30.0–36.0)
MCV: 92.7 fL (ref 78.0–100.0)
Platelets: 255 10*3/uL (ref 150–400)
RBC: 4.25 MIL/uL (ref 4.22–5.81)
RDW: 13.3 % (ref 11.5–15.5)
WBC: 12.3 10*3/uL — AB (ref 4.0–10.5)

## 2014-12-18 LAB — BASIC METABOLIC PANEL
Anion gap: 11 (ref 5–15)
BUN: 14 mg/dL (ref 6–23)
CALCIUM: 8.6 mg/dL (ref 8.4–10.5)
CO2: 26 mmol/L (ref 19–32)
Chloride: 101 mmol/L (ref 96–112)
Creatinine, Ser: 0.91 mg/dL (ref 0.50–1.35)
Glucose, Bld: 128 mg/dL — ABNORMAL HIGH (ref 70–99)
Potassium: 3.9 mmol/L (ref 3.5–5.1)
Sodium: 138 mmol/L (ref 135–145)

## 2014-12-18 MED ORDER — OXYCODONE-ACETAMINOPHEN 5-325 MG PO TABS: 1.0000 | ORAL_TABLET | ORAL | Status: DC | PRN

## 2014-12-18 MED ORDER — METHOCARBAMOL 500 MG PO TABS: 500.0000 mg | ORAL_TABLET | Freq: Four times a day (QID) | ORAL | Status: DC | PRN

## 2014-12-18 MED ORDER — RIVAROXABAN 10 MG PO TABS: 10.0000 mg | ORAL_TABLET | Freq: Every day | ORAL | Status: DC

## 2014-12-18 NOTE — Evaluation (Signed)
Occupational Therapy Evaluation Patient Details Name: JAYCEION LISENBY MRN: [id number] DOB: [birth year] Today's Date: 12/18/2014    History of Present Illness L TKA   Clinical Impression   Pt is s/p TKA resulting in the deficits listed below (see OT Problem List).  Pt will benefit from skilled OT to increase their safety and independence with ADL and functional mobility for ADL to facilitate discharge to venue listed below.        Follow Up Recommendations  No OT follow up    Equipment Recommendations  None recommended by OT    Recommendations for Other Services       Precautions / Restrictions Precautions Precautions: Knee      Mobility Bed Mobility Overal bed mobility: Modified Independent                Transfers Overall transfer level: Needs assistance Equipment used: Rolling walker (2 wheeled) Transfers: Sit to/from Stand Sit to Stand: Min guard         General transfer comment: cues for safety    Balance                                            ADL Overall ADL's : Needs assistance/impaired     Grooming: Sitting;Set up   Upper Body Bathing: Set up;Sitting   Lower Body Bathing: Minimal assistance;Sit to/from stand   Upper Body Dressing : Set up;Sitting   Lower Body Dressing: Moderate assistance;Sit to/from Water engineer Details (indicate cue type and reason): verbalized safety- will practice next OT session Functional mobility during ADLs: Minimal assistance;Rolling walker       Vision     Perception     Praxis      Pertinent Vitals/Pain Pain Assessment: 0-10 Pain Score: 5  Pain Descriptors / Indicators: Aching;Sore Pain Intervention(s): Monitored during session;Premedicated before session;Ice applied;Repositioned     Hand Dominance     Extremity/Trunk Assessment Upper Extremity Assessment Upper Extremity Assessment: Overall WFL for tasks assessed   Lower Extremity  Assessment Lower Extremity Assessment: LLE deficits/detail LLE Deficits / Details: unable to perform SLR yet, knee flexion 15-60   Cervical / Trunk Assessment Cervical / Trunk Assessment: Normal   Communication Communication Communication: No difficulties   Cognition Arousal/Alertness: Awake/alert Behavior During Therapy: WFL for tasks assessed/performed Overall Cognitive Status: Within Functional Limits for tasks assessed                     General Comments       Exercises       Shoulder Instructions      Home Living Family/patient expects to be discharged to:: Private residence Living Arrangements: Spouse/significant other Available Help at Discharge: Family Type of Home: House Home Access: Ramped entrance Entrance Stairs-Number of Steps: 3 Entrance Stairs-Rails: Right Home Layout: One level               Home Equipment: Environmental consultant - 2 wheels   Additional Comments: can borrow RW      Prior Functioning/Environment Level of Independence: Independent             OT Diagnosis: Generalized weakness   OT Problem List: Decreased strength   OT Treatment/Interventions: Self-care/ADL training;DME and/or AE instruction;Patient/family education    OT Goals(Current goals can be found in the care plan  section) Acute Rehab OT Goals Patient Stated Goal: to go home, walk without pain  OT Frequency: Min 2X/week   Barriers to D/C:            Co-evaluation              End of Session Nurse Communication: Mobility status  Activity Tolerance: Patient tolerated treatment well Patient left: in bed   Time: 1048-1100 OT Time Calculation (min): 12 min Charges:  OT General Charges $OT Visit: 1 Procedure OT Evaluation $Initial OT Evaluation Tier I: 1 Procedure G-Codes:    Einar CrowEDDING, Jamicah Anstead D 12/18/2014, 1:16 PM

## 2014-12-18 NOTE — Discharge Instructions (Addendum)
Information on my medicine - XARELTO (Rivaroxaban)  This medication education was reviewed with me or my healthcare representative as part of my discharge preparation.  The pharmacist that spoke with me during my hospital stay was:  Absher, Ky Barbanandall K, RPH  Why was Xarelto prescribed for you? Xarelto was prescribed for you to reduce the risk of blood clots forming after orthopedic surgery. The medical term for these abnormal blood clots is venous thromboembolism (VTE).  What do you need to know about xarelto ? Take your Xarelto ONCE DAILY at the same time every day. You may take it either with or without food.  If you have difficulty swallowing the tablet whole, you may crush it and mix in applesauce just prior to taking your dose.  Take Xarelto exactly as prescribed by your doctor and DO NOT stop taking Xarelto without talking to the doctor who prescribed the medication.  Stopping without other VTE prevention medication to take the place of Xarelto may increase your risk of developing a clot.  After discharge, you should have regular check-up appointments with your healthcare provider that is prescribing your Xarelto.    What do you do if you miss a dose? If you miss a dose, take it as soon as you remember on the same day then continue your regularly scheduled once daily regimen the next day. Do not take two doses of Xarelto on the same day.   Important Safety Information A possible side effect of Xarelto is bleeding. You should call your healthcare provider right away if you experience any of the following: ? Bleeding from an injury or your nose that does not stop. ? Unusual colored urine (red or dark brown) or unusual colored stools (red or black). ? Unusual bruising for unknown reasons. ? A serious fall or if you hit your head (even if there is no bleeding).  Some medicines may interact with Xarelto and might increase your risk of bleeding while on Xarelto. To help avoid  this, consult your healthcare provider or pharmacist prior to using any new prescription or non-prescription medications, including herbals, vitamins, non-steroidal anti-inflammatory drugs (NSAIDs) and supplements.  This website has more information on Xarelto: [email or web address].xarelto.com.   INCREASE YOUR ACTIVITIES AS COMFORT ALLOWS. WORK AGGRESSIVELY ON KNEE MOTION. YOU CAN GET YOUR CURRENT KNEE DRESSING WET DAILY IN THE SHOWER. TRY TO LEAVE YOUR DRESSING ON UNTIL YOUR OUTPATIENT FOLLOW-UP. DO GET AN OVER-THE-COUNTER STOOL SOFTENER TO TAKE AS NEEDED.

## 2014-12-18 NOTE — Evaluation (Signed)
Physical Therapy Evaluation Patient Details Name: DANZIG MACGREGOR MRN: [id number] DOB: Nov 30, 1955 Today's Date: 12/18/2014   History of Present Illness  L TKA  Clinical Impression  Patient has been ambulating  Since last evening with family. Tolerating well, instructed in how to position L leg in neutral to facilitate stretching hamstring as Leg tends to Roll outwardly. Patient will benefit from PT to address problems listed in note below.    Follow Up Recommendations Home health PT;Supervision/Assistance - 24 hour    Equipment Recommendations  None recommended by PT    Recommendations for Other Services       Precautions / Restrictions Precautions Precautions: Knee Restrictions Weight Bearing Restrictions: No      Mobility  Bed Mobility Overal bed mobility: Modified Independent                Transfers Overall transfer level: Needs assistance Equipment used: Rolling walker (2 wheeled) Transfers: Sit to/from Stand Sit to Stand: Min guard         General transfer comment: cues for safety  Ambulation/Gait Ambulation/Gait assistance: Min guard Ambulation Distance (Feet): 150 Feet Assistive device: Rolling walker (2 wheeled) Gait Pattern/deviations: Step-to pattern;Antalgic;Step-through pattern;Decreased stance time - left     General Gait Details: cues for sequence and increasing WB on LLE as tolerated.  Stairs            Wheelchair Mobility    Modified Rankin (Stroke Patients Only)       Balance                                             Pertinent Vitals/Pain Pain Assessment: 0-10 Pain Score: 5  Pain Descriptors / Indicators: Aching;Sore Pain Intervention(s): Monitored during session;Premedicated before session;Ice applied;Repositioned    Home Living Family/patient expects to be discharged to:: Private residence Living Arrangements: Spouse/significant other Available Help at Discharge: Family Type of Home:  House Home Access: Ramped entrance Entrance Stairs-Rails: Right Entrance Stairs-Number of Steps: 3 Home Layout: One level Home Equipment: Walker - 2 wheels Additional Comments: can borrow RW    Prior Function Level of Independence: Independent               Hand Dominance        Extremity/Trunk Assessment   Upper Extremity Assessment: Overall WFL for tasks assessed           Lower Extremity Assessment: LLE deficits/detail   LLE Deficits / Details: unable to perform SLR yet, knee flexion 15-60  Cervical / Trunk Assessment: Normal  Communication   Communication: No difficulties  Cognition Arousal/Alertness: Awake/alert Behavior During Therapy: WFL for tasks assessed/performed Overall Cognitive Status: Within Functional Limits for tasks assessed                      General Comments      Exercises Total Joint Exercises Ankle Circles/Pumps: AROM;Supine;Both;10 reps Quad Sets: AROM;Supine;Both;10 reps Heel Slides: AAROM;Supine;Left;10 reps Hip ABduction/ADduction: AAROM;Supine;Left;10 reps Straight Leg Raises: AAROM;Supine;10 reps      Assessment/Plan    PT Assessment Patient needs continued PT services  PT Diagnosis Difficulty walking;Acute pain   PT Problem List Decreased strength;Decreased range of motion;Decreased safety awareness;Decreased activity tolerance;Decreased mobility;Decreased knowledge of precautions;Decreased knowledge of use of DME;Pain  PT Treatment Interventions DME instruction;Gait training;Stair training;Functional mobility training;Therapeutic activities;Therapeutic exercise;Patient/family education   PT Goals (Current goals can be  found in the Care Plan section) Acute Rehab PT Goals Patient Stated Goal: to go home, walk without pain PT Goal Formulation: With patient Time For Goal Achievement: 12/21/14 Potential to Achieve Goals: Good    Frequency 7X/week   Barriers to discharge        Co-evaluation                End of Session   Activity Tolerance: Patient tolerated treatment well Patient left: with call bell/phone within reach;with family/visitor present Nurse Communication: Mobility status         Time: [id number] PT Time Calculation (min) (ACUTE ONLY): 28 min   Charges:   PT Evaluation $Initial PT Evaluation Tier I: 1 Procedure PT Treatments $Gait Training: 8-22 mins   PT G Codes:        Rada HayHill, Bearl Talarico Elizabeth 12/18/2014, 11:09 AM  Blanchard KelchKaren Bethzaida Boord PT [phone]

## 2014-12-18 NOTE — Progress Notes (Signed)
PT Cancellation Note  Patient Details Name: Donald Wood MRN: [id number] DOB: 07-15-1956   Cancelled Treatment:    Reason Eval/Treat Not Completed: Other (comment) (observed ambulating with spouse earlier, now in bed, ready fpr medication and CPM.)   Rada HayHill, Milisa Kimbell Elizabeth 12/18/2014, 4:56 PM

## 2014-12-18 NOTE — Progress Notes (Signed)
Subjective: Pt doing well - has walked in room and hall   Objective: Vital signs in last 24 hours: Temp:  [97.4 F (36.3 C)-98.1 F (36.7 C)] 97.8 F (36.6 C) (03/19 0600) Pulse Rate:  [44-66] 60 (03/19 0600) Resp:  [14-22] 16 (03/19 0600) BP: (128-199)/(61-101) 135/79 mmHg (03/19 0600) SpO2:  [96 %-100 %] 96 % (03/19 0600) Weight:  [107.502 kg (237 lb)] 107.502 kg (237 lb) (03/18 1515)  Intake/Output from previous day: 03/18 0701 - 03/19 0700 In: 3600 [P.O.:360; I.V.:3240] Out: 710 [Urine:600; Emesis/NG output:10; Blood:100] Intake/Output this shift:    Exam:  Dorsiflexion/Plantar flexion intact  Labs:  Recent Labs  12/18/14 0527  HGB 12.9*    Recent Labs  12/18/14 0527  WBC 12.3*  RBC 4.25  HCT 39.4  PLT 255    Recent Labs  12/18/14 0527  NA 138  K 3.9  CL 101  CO2 26  BUN 14  CREATININE 0.91  GLUCOSE 128*  CALCIUM 8.6   No results for input(s): LABPT, INR in the last 72 hours.  Assessment/Plan: Plan PT today with stairs and dc to home am   Donald Wood 12/18/2014, 11:51 AM

## 2014-12-18 NOTE — Op Note (Signed)
NAMENICKALAS, MCCARRICK NO.:  [id number]  MEDICAL RECORD NO.:  [id number]  LOCATION:  1616                         FACILITY:  [facility]  PHYSICIAN:  Vanita Panda. 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  PROCEDURE:  Left total knee arthroplasty.  IMPLANTS:  Stryker Triathlon knee with size 4 femur, size 5 tibial tray, 13 mm fixed bearing polyethylene insert, size 35 patella button.  SURGEON:  Doneen Poisson MD  ASSISTANT:  Richardean Canal, PA-C  ANESTHESIA: 1. General. 2. Local with Exparel.  TOURNIQUET TIME:  1 hour.  ANTIBIOTICS:  2 g of IV Ancef.  BLOOD LOSS:  Less than 100 mL.  COMPLICATIONS:  None.  INDICATIONS:  Mr. Carfagno is a 59 year old gentleman with worsening  left knee pain for 2-3 years now.  He felt that this may have just been a meniscal tear.  We tried injections in his knee and this did not help. The  plain films showed tricompartmental arthritis but still what we felt was decently maintained joint space.  However, an MRI confirmed complete denuding of the cartilage on the medial aspect of his knee as well as denuding the cartilage throughout.  Due to his continued effusions, daily pain, decreased mobility and decreased quality of life, he did wish to proceed with a total knee arthroplasty.  He understands the risks of acute blood loss anemia, nerve and vessel injury, fracture, and infection, as well as DVT.  He understands the goals are decreased pain, improved mobility, and overall improved quality of life.  DESCRIPTION OF PROCEDURE:  After informed consent was obtained, appropriate left knee was marked.  He was brought to the operating  room, placed upon the operating table.  General anesthesia was then obtained. A nonsterile tourniquet was placed on his upper left thigh and his left leg was prepped and draped with DuraPrep and sterile drapes including sterile stockinette.  A time-out was called to identify correct patient and  correct left knee.  I then made a midline incision over the patella and carried this proximally and distally.  I dissected down to the knee joint and carried out a medial parapatellar arthrotomy.  A large effusion was drained from the knee at that point.  We did find complete loss of cartilage on the medial compartment of the knee as well as complex meniscal tearing with thinning throughout the rest of his knee and definitely a varus deformity.  With the knee in a flexed position and using the extramedullary cutting guide, we set our tibial cut to take 9 mm off the high side,  correcting for varus, valgus, and neutral slope.  We then made this cut without difficulty.  Next, we then returned to the femur.  We drilled a hole to the intercondylar area of the knee, femur, and through an intramedullary guide, put a femoral cutting guide at 5 degrees left and externally rotated, and for  a 10 mm cut, we made this cut without difficulty and brought it back down to extension, and with a 10 mm extension block, we still hyperextend a little.  We then removed all pins from the knee and went back to the femur.  We put a femoral sizing guide based on the epicondylar axis and Whiteside's line, and chose a size 4 femur with a 4-in-1 cutting block for a 4 femur.  We made anterior and posterior cuts followed by chamfer cuts, and then we made a femoral box cut.  We then went to the tibia and set the rotation of the femur and the tibial tubercle and chose a size 5 tibial tray for coverage over the tibia.  We made our keel punch off this and with the trial 5 tibia and a trial 4 femur, we placed actually a 13 mm fix  bearing polyethylene insert trial, and I was pleased with the  stability and range of motion of his knee at that point.  We then made our patellar cut and took 10 mm off the undersurface of the patella and drilled holes for 35 patella lug.  With all trial components in, I was again pleased with stability and range of motion as well as alignment.  I then removed all trial components, and we irrigated the knee with normal saline solution using pulsatile lavage.  We then infiltrated 20 mL of Exparel diluted with 40 mL of normal saline around the joint capsule.  We then mixed our cement and cemented the real Stryker Triathlon tibial tray, size 5 followed by the real size 4 femur. We cleaned cement debris from the knee and placed a 13 mm fix bearing polyethylene insert and cemented the patellar button.  Once it was cement had all hardened and dried, we again removed cement debris from the knee.  With the tourniquet down, hemostasis was obtained with electrocautery.  We then closed the arthrotomy with interrupted #1 Vicryl suture followed by 0 Vicryl in the deep tissue, 2-0 Vicryl in the subcutaneous tissue, 4-0 Monocryl in subcuticular stitch, and Steri- Strips on the skin.  Well-padded sterile dressing was applied, and he was awakened, extubated, and taken to the recovery room in stable condition.  All final counts were correct.  There were no complications noted.     Vanita Pandahristopher Y. Magnus IvanBlackman, M.D.     CYB/MEDQ  D:  12/17/2014  T:  12/18/2014  Job:  [id number]

## 2014-12-19 LAB — CBC
HCT: 35.7 % — ABNORMAL LOW (ref 39.0–52.0)
HEMOGLOBIN: 11.8 g/dL — AB (ref 13.0–17.0)
MCH: 30.6 pg (ref 26.0–34.0)
MCHC: 33.1 g/dL (ref 30.0–36.0)
MCV: 92.7 fL (ref 78.0–100.0)
Platelets: 223 10*3/uL (ref 150–400)
RBC: 3.85 MIL/uL — ABNORMAL LOW (ref 4.22–5.81)
RDW: 13.2 % (ref 11.5–15.5)
WBC: 10.8 10*3/uL — AB (ref 4.0–10.5)

## 2014-12-19 NOTE — Care Management Note (Signed)
CARE MANAGEMENT NOTE 12/19/2014  Patient:  Darol DestineRYAN,Miciah R   Account Number:  [id number]  Date Initiated:  12/18/2014  Documentation initiated by:  [facility]ary[facility]AVIS,ALESIA  Subjective/Objective Assessment:   Left total knee arthroplasty     Action/Plan:   Anticipated DC Date:     Anticipated DC Plan:  HOME W HOME HEALTH SERVICES      DC Planning Services  CM consult      Choice offered to / List presented to:     DME arranged  [facility]      DME agency  [facility]        Status of service:  In process, will continue to follow Medicare Important Message given?   (If response is "NO", the following Medicare IM given date fields will be blank) Date Medicare IM given:   Medicare IM given by:   Date Additional Medicare IM given:   Additional Medicare IM given by:    Discharge Disposition:  HOME W HOME HEALTH SERVICES  Per UR Regulation:    If discussed at Long Length of Stay Meetings, dates discussed:    Comments:  12/19/14 - CM spoke with patient via telephone. Needs a rolling walker, denies the need for a 3N1. [facility] and will deliver prior to discharge. Rubie MaidCrystal Jacquiline Zurcher RN BSN CCM [phone]  12/18/2014 1830 [facility] arranged for HH. Isidoro DonningAlesia Shavis RN CCM Case Mgmt phone [phone]

## 2014-12-19 NOTE — Progress Notes (Signed)
Discharge instructions given. Pt verbalized understanding and all questions were answered.  

## 2014-12-19 NOTE — Progress Notes (Signed)
OT  Note  Patient Details Name: Donald Wood MRN: [id number] DOB: 1956/05/03   Cancelled Treatment:    Reason Eval/Treat Not Completed: Other (comment) Spoke with pt , No further OT needs.   Alba CoryREDDING, Bion Todorov D 12/19/2014, 11:11 AM

## 2014-12-19 NOTE — Progress Notes (Signed)
Physical Therapy Treatment Patient Details Name: Donald DestineJames R Wood MRN: [id number] DOB: 1956/05/22 Today's Date: 12/19/2014    History of Present Illness L TKA    PT Comments    Patient tolerating increased  Knee flexion, still difficulty with extension, encouraged patient to keep elevated and propped foot to facilitate extension.  Follow Up Recommendations  Home health PT;Supervision/Assistance - 24 hour     Equipment Recommendations  Rolling walker with 5" wheels    Recommendations for Other Services       Precautions / Restrictions Precautions Precautions: Fall    Mobility  Bed Mobility Overal bed mobility: Modified Independent                Transfers   Equipment used: Rolling walker (2 wheeled) Transfers: Sit to/from Stand Sit to Stand: Modified independent (Device/Increase time)            Ambulation/Gait Ambulation/Gait assistance: Supervision Ambulation Distance (Feet): 250 Feet Assistive device: Rolling walker (2 wheeled) Gait Pattern/deviations: Step-to pattern;Antalgic     General Gait Details: cues for increasing weight on  L foot, attempt to straighten Knee at heel strike.    Stairs Stairs: Yes Stairs assistance: Min guard Stair Management: Two rails;Backwards;Forwards Number of Stairs: 3 General stair comments: practiced x 3  cues for safety due to   L knee buckling potential  Wheelchair Mobility    Modified Rankin (Stroke Patients Only)       Balance                                    Cognition Arousal/Alertness: Awake/alert                          Exercises Total Joint Exercises Ankle Circles/Pumps: AROM;Supine;Both;10 reps Quad Sets: AROM;Supine;Both;10 reps Short Arc QuadBarbaraann Wood: AAROM;Left;15 reps;Supine Heel Slides: AAROM;Left;15 reps;Supine Hip ABduction/ADduction: AROM;Left;15 reps;Supine Straight Leg Raises: AAROM;Left;15 reps;Supine Goniometric ROM: 10-60 L knee flexion    General Comments         Pertinent Vitals/Pain Pain Score: 4  Pain Location: L knee Pain Descriptors / Indicators: Aching;Burning;Tightness;Throbbing Pain Intervention(s): Premedicated before session;Monitored during session;Repositioned;Ice applied    Home Living                      Prior Function            PT Goals (current goals can now be found in the care plan section) Progress towards PT goals: Progressing toward goals    Frequency       PT Plan Current plan remains appropriate    Co-evaluation             End of Session   Activity Tolerance: Patient tolerated treatment well Patient left: with call bell/phone within reach     Time: 1033-1100 PT Time Calculation (min) (ACUTE ONLY): 27 min  Charges:  $Gait Training: 8-22 mins $Therapeutic Exercise: 8-22 mins                    G Codes:      Donald Wood, Donald Wood 12/19/2014, 2:22 PM

## 2014-12-19 NOTE — Progress Notes (Signed)
Subjective: Pt stable - moving well - ready for dc to home   Objective: Vital signs in last 24 hours: Temp:  [97.9 F (36.6 C)-98.4 F (36.9 C)] 98.3 F (36.8 C) (03/20 0607) Pulse Rate:  [60-65] 65 (03/20 0607) Resp:  [14-19] 19 (03/20 0800) BP: (126-145)/(57-59) 126/57 mmHg (03/20 0607) SpO2:  [93 %-98 %] 98 % (03/20 0607)  Intake/Output from previous day: 03/19 0701 - 03/20 0700 In: 888.8 [P.O.:360; I.V.:528.8] Out: -  Intake/Output this shift: Total I/O In: 120 [P.O.:120] Out: -   Exam:  Neurovascular intact Sensation intact distally Intact pulses distally Dorsiflexion/Plantar flexion intact  Labs:  Recent Labs  12/18/14 0527 12/19/14 0445  HGB 12.9* 11.8*    Recent Labs  12/18/14 0527 12/19/14 0445  WBC 12.3* 10.8*  RBC 4.25 3.85*  HCT 39.4 35.7*  PLT 255 223    Recent Labs  12/18/14 0527  NA 138  K 3.9  CL 101  CO2 26  BUN 14  CREATININE 0.91  GLUCOSE 128*  CALCIUM 8.6   No results for input(s): LABPT, INR in the last 72 hours.  Assessment/Plan: Dc home   DEAN,GREGORY SCOTT 12/19/2014, 10:05 AM

## 2014-12-19 NOTE — Discharge Summary (Signed)
Patient ID: Donald Wood MRN: [id number] DOB/AGE: [birth year] 59 y.o.  Admit date: 12/17/2014 Discharge date: 12/19/2014  Admission Diagnoses:  Principal Problem:   Osteoarthritis of left knee Active Problems:   Status post total left knee replacement   Discharge Diagnoses:  Same  Past Medical History  Diagnosis Date  . Arthritis     Surgeries: Procedure(s): LEFT TOTAL KNEE ARTHROPLASTY on 12/17/2014   Consultants:    Discharged Condition: Improved  Hospital Course: Donald Wood is an 59 y.o. male who was admitted 12/17/2014 for operative treatment ofOsteoarthritis of left knee. Patient has severe unremitting pain that affects sleep, daily activities, and work/hobbies. After pre-op clearance the patient was taken to the operating room on 12/17/2014 and underwent  Procedure(s): LEFT TOTAL KNEE ARTHROPLASTY.    Patient was given perioperative antibiotics: Anti-infectives    Start     Dose/Rate Route Frequency Ordered Stop   12/17/14 1600  ceFAZolin (ANCEF) IVPB 1 g/50 mL premix     1 g 100 mL/hr over 30 Minutes Intravenous Every 6 hours 12/17/14 1440 12/17/14 2239   12/17/14 0734  ceFAZolin (ANCEF) IVPB 2 g/50 mL premix     2 g 100 mL/hr over 30 Minutes Intravenous On call to O.R. 12/17/14 0734 12/17/14 1024       Patient was given sequential compression devices, early ambulation, and chemoprophylaxis to prevent DVT.  Patient benefited maximally from hospital stay and there were no complications.    Recent vital signs: Patient Vitals for the past 24 hrs:  BP Temp Temp src Pulse Resp SpO2  12/19/14 0800 - - - - 19 -  12/19/14 7829 (!) 126/57 mmHg 98.3 F (36.8 C) Oral 65 16 98 %  12/19/14 0400 - - - - 14 97 %  12/19/14 0000 - - - - 14 97 %  12/18/14 2113 (!) 145/59 mmHg 98.4 F (36.9 C) Oral 60 16 97 %  12/18/14 2000 - - - - 16 -  12/18/14 1436 (!) 135/59 mmHg 97.9 F (36.6 C) Oral 64 16 93 %     Recent laboratory studies:  Recent Labs  12/18/14 0527  12/19/14 0445  WBC 12.3* 10.8*  HGB 12.9* 11.8*  HCT 39.4 35.7*  PLT 255 223  NA 138  --   K 3.9  --   CL 101  --   CO2 26  --   BUN 14  --   CREATININE 0.91  --   GLUCOSE 128*  --   CALCIUM 8.6  --      Discharge Medications:     Medication List    STOP taking these medications        acetaminophen-codeine 300-30 MG per tablet  Commonly known as:  TYLENOL #3     diclofenac 75 MG EC tablet  Commonly known as:  VOLTAREN     traMADol 50 MG tablet  Commonly known as:  ULTRAM      TAKE these medications        methocarbamol 500 MG tablet  Commonly known as:  ROBAXIN  Take 1 tablet (500 mg total) by mouth every 6 (six) hours as needed for muscle spasms.     oxyCODONE-acetaminophen 5-325 MG per tablet  Commonly known as:  ROXICET  Take 1-2 tablets by mouth every 4 (four) hours as needed.     rivaroxaban 10 MG Tabs tablet  Commonly known as:  XARELTO  Take 1 tablet (10 mg total) by mouth daily with breakfast.  Diagnostic Studies: Mr Knee Left  Wo Contrast  11/25/2014   CLINICAL DATA:  Pain about the patella and medial aspect of the left knee for 2-3 years. No known injury. Initial encounter.  EXAM: MRI OF THE LEFT KNEE WITHOUT CONTRAST  TECHNIQUE: Multiplanar, multisequence MR imaging of the knee was performed. No intravenous contrast was administered.  COMPARISON:  None.  FINDINGS: MENISCI  Medial meniscus: The medial meniscus is diffusely degenerated and torn. Tearing is predominantly oblique in orientation reaching the meniscal undersurface throughout the posterior horn and body.  Lateral meniscus: A focal radial tear along the free edge of the posterior body of the lateral meniscus is identified.  LIGAMENTS  Cruciates: Intact. Severe appearing mucoid degeneration of the posterior cruciate ligament is identified.  Collaterals:  Intact.  CARTILAGE  Patellofemoral: Hyaline cartilage loss is identified and most notable in the inferior aspect of the lateral patellar  facet and centrally in the femoral trochlea. Subchondral edema is seen in the lateral patellar facet.  Medial: Hyaline cartilage appears almost completely denuded with associated marked joint space narrowing.  Lateral:  Mildly degenerated.  Joint: Small to moderate joint effusion is noted. A loose body measuring 0.5 cm in diameter is seen in the anterior aspect of the medial compartment.  Popliteal Fossa:  No Baker's cyst.  Extensor Mechanism:  Intact.  Bones: Bulky tricompartmental osteophytes are identified. Subchondral edema is seen about the medial compartment.  IMPRESSION: Dominant finding is advanced tricompartmental osteoarthritis which appears worst in the medial compartment. Associated extensive tearing of the medial meniscus is identified as described above.  Focal radial tear along the free edge of the posterior aspect of the body of the lateral meniscus.  Severe mucoid degeneration of the posterior cruciate ligament without tear.   Electronically Signed   By: Drusilla Kannerhomas  Dalessio M.D.   On: 11/25/2014 09:15   Dg Knee Left Port  12/17/2014   CLINICAL DATA:  Status post arthroplasty  EXAM: PORTABLE LEFT KNEE - 1-2 VIEW  COMPARISON:  Left knee MRI November 24, 2014  FINDINGS: Frontal and lateral views were obtained. The patient is status post total knee replacement with femoral and tibial components appearing well-seated. No fracture or dislocation. Soft tissue air and air within the joint is an expected postoperative finding.  IMPRESSION: Prosthetic components appear well seated. No acute fracture or dislocation.   Electronically Signed   By: Bretta BangWilliam  Woodruff III M.D.   On: 12/17/2014 13:40    Disposition:   To home      Discharge Instructions    Call MD / Call 911    Complete by:  As directed   If you experience chest pain or shortness of breath, CALL 911 and be transported to the hospital emergency room.  If you develope a fever above 101 F, pus (white drainage) or increased drainage or redness  at the wound, or calf pain, call your surgeon's office.     Constipation Prevention    Complete by:  As directed   Drink plenty of fluids.  Prune juice may be helpful.  You may use a stool softener, such as Colace (over the counter) 100 mg twice a day.  Use MiraLax (over the counter) for constipation as needed.     Diet - low sodium heart healthy    Complete by:  As directed      Increase activity slowly as tolerated    Complete by:  As directed  Follow-up Information    Follow up with Kathryne Hitch, MD In 2 weeks.   Specialty:  Orthopedic Surgery   Contact information:   [address] [place] [address] [phone]        Signed: Kathryne Hitch 12/19/2014, 12:08 PM

## 2015-07-29 ENCOUNTER — Ambulatory Visit (HOSPITAL_COMMUNITY): Admit: 2015-07-29 | Payer: Self-pay | Admitting: Cardiovascular Disease

## 2015-07-29 ENCOUNTER — Encounter (HOSPITAL_COMMUNITY): Payer: Self-pay | Admitting: Physician Assistant

## 2015-07-29 ENCOUNTER — Inpatient Hospital Stay (HOSPITAL_COMMUNITY): Payer: 59

## 2015-07-29 ENCOUNTER — Encounter (HOSPITAL_COMMUNITY)
Admission: EM | Disposition: A | Discharge status: Home / Self Care | Payer: Self-pay | Source: Home / Self Care | Attending: Cardiovascular Disease

## 2015-07-29 ENCOUNTER — Inpatient Hospital Stay (HOSPITAL_COMMUNITY)
Admission: EM | Admit: 2015-07-29 | Discharge: 2015-07-31 | DRG: 247 | Disposition: A | Discharge status: Home / Self Care | Payer: 59 | Attending: Cardiovascular Disease | Admitting: Cardiovascular Disease

## 2015-07-29 DIAGNOSIS — Z8249 Family history of ischemic heart disease and other diseases of the circulatory system: Secondary | ICD-10-CM | POA: Diagnosis not present

## 2015-07-29 DIAGNOSIS — I10 Essential (primary) hypertension: Secondary | ICD-10-CM | POA: Diagnosis present

## 2015-07-29 DIAGNOSIS — I2111 ST elevation (STEMI) myocardial infarction involving right coronary artery: Secondary | ICD-10-CM | POA: Diagnosis not present

## 2015-07-29 DIAGNOSIS — E785 Hyperlipidemia, unspecified: Secondary | ICD-10-CM | POA: Diagnosis present

## 2015-07-29 DIAGNOSIS — I2510 Atherosclerotic heart disease of native coronary artery without angina pectoris: Secondary | ICD-10-CM | POA: Diagnosis present

## 2015-07-29 DIAGNOSIS — I2119 ST elevation (STEMI) myocardial infarction involving other coronary artery of inferior wall: Secondary | ICD-10-CM | POA: Diagnosis present

## 2015-07-29 DIAGNOSIS — E669 Obesity, unspecified: Secondary | ICD-10-CM | POA: Diagnosis present

## 2015-07-29 DIAGNOSIS — E6609 Other obesity due to excess calories: Secondary | ICD-10-CM | POA: Diagnosis present

## 2015-07-29 DIAGNOSIS — Z96652 Presence of left artificial knee joint: Secondary | ICD-10-CM | POA: Diagnosis present

## 2015-07-29 DIAGNOSIS — R739 Hyperglycemia, unspecified: Secondary | ICD-10-CM | POA: Diagnosis present

## 2015-07-29 DIAGNOSIS — I472 Ventricular tachycardia: Secondary | ICD-10-CM | POA: Diagnosis not present

## 2015-07-29 DIAGNOSIS — R079 Chest pain, unspecified: Secondary | ICD-10-CM | POA: Diagnosis present

## 2015-07-29 HISTORY — DX: Atherosclerotic heart disease of native coronary artery without angina pectoris: I25.10

## 2015-07-29 HISTORY — DX: Hyperglycemia, unspecified: R73.9

## 2015-07-29 HISTORY — PX: CARDIAC CATHETERIZATION: SHX172

## 2015-07-29 HISTORY — DX: Hyperlipidemia, unspecified: E78.5

## 2015-07-29 HISTORY — DX: Obesity, unspecified: E66.9

## 2015-07-29 LAB — PROTIME-INR
INR: 1.5 — ABNORMAL HIGH (ref 0.00–1.49)
Prothrombin Time: 18.1 seconds — ABNORMAL HIGH (ref 11.6–15.2)

## 2015-07-29 LAB — CBC WITH DIFFERENTIAL/PLATELET
BASOS ABS: 0 10*3/uL (ref 0.0–0.1)
BASOS PCT: 0 %
EOS PCT: 0 %
Eosinophils Absolute: 0 10*3/uL (ref 0.0–0.7)
HCT: 44 % (ref 39.0–52.0)
Hemoglobin: 15.1 g/dL (ref 13.0–17.0)
Lymphocytes Relative: 7 %
Lymphs Abs: 1.1 10*3/uL (ref 0.7–4.0)
MCH: 31 pg (ref 26.0–34.0)
MCHC: 34.3 g/dL (ref 30.0–36.0)
MCV: 90.3 fL (ref 78.0–100.0)
MONO ABS: 0.5 10*3/uL (ref 0.1–1.0)
Monocytes Relative: 4 %
NEUTROS ABS: 13.2 10*3/uL — AB (ref 1.7–7.7)
Neutrophils Relative %: 89 %
PLATELETS: 272 10*3/uL (ref 150–400)
RBC: 4.87 MIL/uL (ref 4.22–5.81)
RDW: 13.3 % (ref 11.5–15.5)
WBC: 14.8 10*3/uL — ABNORMAL HIGH (ref 4.0–10.5)

## 2015-07-29 LAB — COMPREHENSIVE METABOLIC PANEL
ALT: 39 U/L (ref 17–63)
AST: 55 U/L — ABNORMAL HIGH (ref 15–41)
Albumin: 3.9 g/dL (ref 3.5–5.0)
Alkaline Phosphatase: 53 U/L (ref 38–126)
Anion gap: 10 (ref 5–15)
BUN: 8 mg/dL (ref 6–20)
CO2: 24 mmol/L (ref 22–32)
Calcium: 9.3 mg/dL (ref 8.9–10.3)
Chloride: 107 mmol/L (ref 101–111)
Creatinine, Ser: 0.85 mg/dL (ref 0.61–1.24)
GFR calc Af Amer: >60 mL/min (ref >60)
GFR calc non Af Amer: >60 mL/min (ref >60)
Glucose, Bld: 118 mg/dL — ABNORMAL HIGH (ref 65–99)
Potassium: 3.8 mmol/L (ref 3.5–5.1)
Sodium: 141 mmol/L (ref 135–145)
Total Bilirubin: 1.2 mg/dL (ref 0.3–1.2)
Total Protein: 7.1 g/dL (ref 6.5–8.1)

## 2015-07-29 LAB — TROPONIN I: Troponin I: 2.4 ng/mL (ref <0.031)

## 2015-07-29 LAB — MRSA PCR SCREENING: MRSA by PCR: NEGATIVE

## 2015-07-29 LAB — TSH: TSH: 1.07 u[IU]/mL (ref 0.350–4.500)

## 2015-07-29 LAB — MAGNESIUM: Magnesium: 1.8 mg/dL (ref 1.7–2.4)

## 2015-07-29 SURGERY — LEFT HEART CATH AND CORONARY ANGIOGRAPHY
Anesthesia: LOCAL

## 2015-07-29 MED ORDER — NITROGLYCERIN 0.4 MG SL SUBL: 0.4000 mg | SUBLINGUAL_TABLET | SUBLINGUAL | Status: DC | PRN

## 2015-07-29 MED ORDER — BIVALIRUDIN 250 MG IV SOLR
INTRAVENOUS | Status: AC
  Filled 2015-07-29: qty 250

## 2015-07-29 MED ORDER — VERAPAMIL HCL 2.5 MG/ML IV SOLN
INTRAVENOUS | Status: DC | PRN
  Administered 2015-07-29: 17:00:00 via INTRA_ARTERIAL

## 2015-07-29 MED ORDER — ATORVASTATIN CALCIUM 80 MG PO TABS
80.0000 mg | ORAL_TABLET | Freq: Every day | ORAL | Status: DC
  Administered 2015-07-29 – 2015-07-30 (×2): 80 mg via ORAL
  Filled 2015-07-29 (×2): qty 1

## 2015-07-29 MED ORDER — OXYCODONE-ACETAMINOPHEN 5-325 MG PO TABS: 1.0000 | ORAL_TABLET | Freq: Four times a day (QID) | ORAL | Status: DC | PRN

## 2015-07-29 MED ORDER — SODIUM CHLORIDE 0.9 % IV SOLN: 250.0000 mL | INTRAVENOUS | Status: DC | PRN

## 2015-07-29 MED ORDER — ASPIRIN 81 MG PO CHEW
81.0000 mg | CHEWABLE_TABLET | Freq: Every day | ORAL | Status: DC
  Administered 2015-07-30 – 2015-07-31 (×2): 81 mg via ORAL
  Filled 2015-07-29 (×2): qty 1

## 2015-07-29 MED ORDER — TICAGRELOR 90 MG PO TABS
ORAL_TABLET | ORAL | Status: AC
  Filled 2015-07-29: qty 1

## 2015-07-29 MED ORDER — ONDANSETRON HCL 4 MG/2ML IJ SOLN: 4.0000 mg | Freq: Four times a day (QID) | INTRAMUSCULAR | Status: DC | PRN

## 2015-07-29 MED ORDER — TICAGRELOR 90 MG PO TABS
90.0000 mg | ORAL_TABLET | Freq: Two times a day (BID) | ORAL | Status: DC
  Administered 2015-07-30 – 2015-07-31 (×3): 90 mg via ORAL
  Filled 2015-07-29 (×3): qty 1

## 2015-07-29 MED ORDER — SODIUM CHLORIDE 0.9 % IJ SOLN: 3.0000 mL | INTRAMUSCULAR | Status: DC | PRN

## 2015-07-29 MED ORDER — ACETAMINOPHEN 325 MG PO TABS: 650.0000 mg | ORAL_TABLET | ORAL | Status: DC | PRN

## 2015-07-29 MED ORDER — IOHEXOL 350 MG/ML SOLN
INTRAVENOUS | Status: DC | PRN
  Administered 2015-07-29: 165 mL via INTRA_ARTERIAL

## 2015-07-29 MED ORDER — SODIUM CHLORIDE 0.9 % IV SOLN
250.0000 mg | INTRAVENOUS | Status: DC | PRN
  Administered 2015-07-29: 1.75 mg/kg/h via INTRAVENOUS

## 2015-07-29 MED ORDER — SODIUM CHLORIDE 0.9 % IJ SOLN
3.0000 mL | Freq: Two times a day (BID) | INTRAMUSCULAR | Status: DC
Start: 2015-07-29 — End: 2015-07-31
  Administered 2015-07-30 – 2015-07-31 (×2): 3 mL via INTRAVENOUS

## 2015-07-29 MED ORDER — HEPARIN (PORCINE) IN NACL 2-0.9 UNIT/ML-% IJ SOLN
INTRAMUSCULAR | Status: DC | PRN
  Administered 2015-07-29: 18:00:00

## 2015-07-29 MED ORDER — HEPARIN SODIUM (PORCINE) 1000 UNIT/ML IJ SOLN
INTRAMUSCULAR | Status: AC
  Filled 2015-07-29: qty 1

## 2015-07-29 MED ORDER — TICAGRELOR 90 MG PO TABS
ORAL_TABLET | ORAL | Status: DC | PRN
  Administered 2015-07-29: 180 mg via ORAL

## 2015-07-29 MED ORDER — METOPROLOL TARTRATE 12.5 MG HALF TABLET
12.5000 mg | ORAL_TABLET | Freq: Two times a day (BID) | ORAL | Status: DC
  Administered 2015-07-29 – 2015-07-30 (×2): 12.5 mg via ORAL
  Filled 2015-07-29 (×3): qty 1

## 2015-07-29 MED ORDER — OXYCODONE-ACETAMINOPHEN 5-325 MG PO TABS: 1.0000 | ORAL_TABLET | ORAL | Status: DC | PRN

## 2015-07-29 MED ORDER — MORPHINE SULFATE (PF) 2 MG/ML IV SOLN: 2.0000 mg | INTRAVENOUS | Status: DC | PRN

## 2015-07-29 MED ORDER — SODIUM CHLORIDE 0.9 % IV SOLN: INTRAVENOUS | Status: AC

## 2015-07-29 MED ORDER — TIROFIBAN (AGGRASTAT) BOLUS VIA INFUSION
INTRAVENOUS | Status: DC | PRN
  Administered 2015-07-29: 2700 ug via INTRAVENOUS

## 2015-07-29 MED ORDER — HEPARIN SODIUM (PORCINE) 1000 UNIT/ML IJ SOLN
INTRAMUSCULAR | Status: DC | PRN
  Administered 2015-07-29: 4000 [IU] via INTRAVENOUS

## 2015-07-29 MED ORDER — VERAPAMIL HCL 2.5 MG/ML IV SOLN
INTRAVENOUS | Status: AC
  Filled 2015-07-29: qty 2

## 2015-07-29 MED ORDER — BIVALIRUDIN BOLUS VIA INFUSION - CUPID
INTRAVENOUS | Status: DC | PRN
  Administered 2015-07-29: 81 mg via INTRAVENOUS

## 2015-07-29 SURGICAL SUPPLY — 23 items
BALLN EMERGE MR 2.5X12 (BALLOONS) ×2
BALLN EUPHORA RX 3.0X20 (BALLOONS) ×2
BALLN ~~LOC~~ EUPHORA RX4.0X27 (BALLOONS) ×2
BALLOON EMERGE MR 2.5X12 (BALLOONS) IMPLANT
BALLOON EUPHORA RX 3.0X20 (BALLOONS) IMPLANT
BALLOON ~~LOC~~ EUPHORA RX4.0X27 (BALLOONS) IMPLANT
CATH INFINITI 5 FR JL3.5 (CATHETERS) ×2 IMPLANT
CATH INFINITI 5FR ANG PIGTAIL (CATHETERS) ×2 IMPLANT
CATH INFINITI JR4 5F (CATHETERS) ×1 IMPLANT
CATH VISTA GUIDE 6FR JR4 (CATHETERS) ×1 IMPLANT
DEVICE RAD COMP TR BAND LRG (VASCULAR PRODUCTS) ×2 IMPLANT
GLIDESHEATH SLEND SS 6F .021 (SHEATH) ×2 IMPLANT
GUIDELINER 6F (CATHETERS) ×1 IMPLANT
KIT ENCORE 26 ADVANTAGE (KITS) ×1 IMPLANT
KIT HEART LEFT (KITS) ×2 IMPLANT
PACK CARDIAC CATHETERIZATION (CUSTOM PROCEDURE TRAY) ×2 IMPLANT
STENT SYNERGY DES 4X16 (Permanent Stent) ×1 IMPLANT
STENT SYNERGY DES 4X38 (Permanent Stent) ×1 IMPLANT
SYR MEDRAD MARK V 150ML (SYRINGE) ×2 IMPLANT
TRANSDUCER W/STOPCOCK (MISCELLANEOUS) ×2 IMPLANT
TUBING CIL FLEX 10 FLL-RA (TUBING) ×2 IMPLANT
WIRE COUGAR XT STRL 190CM (WIRE) ×2 IMPLANT
WIRE SAFE-T 1.5MM-J .035X260CM (WIRE) ×2 IMPLANT

## 2015-07-29 NOTE — Progress Notes (Signed)
   07/29/15 1730  Clinical Encounter Type  Visited With Family  Visit Type Spiritual support  Referral From Nurse  Spiritual Encounters  Spiritual Needs Emotional  Stress Factors  Family Stress Factors Health changes;Lack of knowledge  Chaplain responded to code stemi; patient already taken to cath lab; chaplain escorted family to waiting room.

## 2015-07-29 NOTE — Progress Notes (Addendum)
07/29/2015 10:33 PM Trop resulted at 2033 now called from lab of 2.44  [facility] Sullivan LoneGilbert aware. Madalynne Gutmann, Linnell Fullingose Burnett

## 2015-07-29 NOTE — H&P (Signed)
History and Physical  Patient ID: Donald Wood MRN: [id number], DOB: 07/29/1956 Date of Encounter: 07/29/2015, 5:10 PM Primary Physician: No primary care provider on file. Primary Cardiologist: New - lives in [facility]  Chief Complaint: chest pain Reason for Admission: inferior STEMI (/possible posterior MI)  HPI: Donald Wood is a 59 y/o M with history of arthritis s/p knee surgery 11/2014 with hyperglycemia noted on labs at that time who otherwise denies any significant PMH. He was mowing his yard today around 3pm when he developed substernal chest pain associated with diaphoresis. No nausea, vomiting, syncope or dyspnea. Symptoms persisted so EMS was called. He was found to be hypertensive at 171/101, HR 55. He was given  ASA, 1 SL NTG, and  of morphine with reduction in pain down to 4/10. He was brought by EMS emergently to [facility] cath lab. He denies any history of bleeding or TIA/CVA. His only medication is PRN pain medicine for his knee. He does not smoke, drink or use drugs. Prior BP in 11/2014 was 162/110 so suspect he has chronic hypertension which is untreated.    Past Medical History  Diagnosis Date  . Arthritis   . Hyperglycemia      Surgical History:  Past Surgical History  Procedure Laterality Date  . Total knee arthroplasty Left 12/17/2014    Procedure: LEFT TOTAL KNEE ARTHROPLASTY;  Surgeon: Kathryne Hitch, MD;  Location: [facility] ORS;  Service: Orthopedics;  Laterality: Left;     Home Meds: PRN pain medication for his knee  Allergies: No Known Allergies  Social History   Social History  . Marital Status: Married    Spouse Name: N/A  . Number of Children: 2  . Years of Education: N/A   Occupational History  . Not on file.   Social History Main Topics  . Smoking status: Never Smoker   . Smokeless tobacco: Not on file  . Alcohol Use: 0.0 oz/week    0 Standard drinks or equivalent per week     Comment: occassionally - but has not had  anything to drink in several months  . Drug Use: No  . Sexual Activity:    Partners: Female   Other Topics Concern  . Not on file   Social History Narrative     Family History  Problem Relation Age of Onset  . CAD Mother     Mom had heart problems in her 4s    Review of Systems: All other systems reviewed and are otherwise negative except as noted above.  Labs: None available Radiology/Studies:  No results found. Wt Readings from Last 3 Encounters:  12/17/14 237 lb (107.502 kg)  12/09/14 236 lb (107.049 kg)  11/05/14 247 lb 9.6 oz (112.311 kg)    EKG: NSR inferior ST elevation inferiorly (up to 4 mm) with less pronounced ST elevation V6, deep ST depression/TWI in I, avL, V1-V2  Physical Exam: BP 144/45, pulse 55, RR 16, Pulse ox SpO2 97 %. RA. General: Well developed obese WM in no acute distress. Head: Normocephalic, atraumatic, sclera non-icteric, no xanthomas, nares are without discharge.  Neck: JVD not elevated. Lungs: Clear bilaterally to auscultation without wheezes, rales, or rhonchi. Breathing is unlabored. Heart: RRR with S1 S2. No murmurs, rubs, or gallops appreciated. Abdomen: Soft, non-tender, non-distended with normoactive bowel sounds. No hepatomegaly. No rebound/guarding. No obvious abdominal masses. Msk:  Strength and tone appear normal for age. Extremities: No clubbing or cyanosis. No edema.  Diminished pedal pulses bilaterally but LE  equally warm. Neuro: Alert and oriented X 3. No focal deficit. No facial asymmetry. Moves all extremities spontaneously. Psych:  Responds to questions appropriately with a normal affect.    ASSESSMENT AND PLAN:   1. Acute inferior (possibly posterior) STEMI with suspected CAD - proceed with emergent cardiac cath. Check lipid panel. Add statin. MD to write order for DAPT and beta blocker depending on outcome of cath.  2. Hyperglycemia - check A1C.  3. Essential HTN - MD to write order for antihypertensives based on  outcome of cath if BP remains high. Check echo to eval for hypertensive heart disease.  4. Obesity (BMI pending) - will need education regarding lifestyle modifications.  SignedRonie Spies, Dayna, Dunn PA-C 07/29/2015, 5:10 PM Pager: [phone]  I have personally seen and examined this patient with Ronie Spiesayna Dunn, PA-C.  I agree with the assessment and plan as outlined above. He is presenting with an inferior STEMI. Plan for emergent cardiac cath with PCI. Further plans to follow cath.   MCALHANY,CHRISTOPHER 07/29/2015 6:32 PM

## 2015-07-30 ENCOUNTER — Inpatient Hospital Stay (HOSPITAL_COMMUNITY): Payer: 59

## 2015-07-30 ENCOUNTER — Encounter (HOSPITAL_COMMUNITY): Payer: Self-pay | Admitting: *Deleted

## 2015-07-30 DIAGNOSIS — E669 Obesity, unspecified: Secondary | ICD-10-CM

## 2015-07-30 DIAGNOSIS — I2510 Atherosclerotic heart disease of native coronary artery without angina pectoris: Secondary | ICD-10-CM

## 2015-07-30 LAB — POCT I-STAT, CHEM 8
BUN: 11 mg/dL (ref 6–20)
CALCIUM ION: 1.15 mmol/L (ref 1.12–1.23)
CREATININE: 0.8 mg/dL (ref 0.61–1.24)
Chloride: 105 mmol/L (ref 101–111)
Glucose, Bld: 136 mg/dL — ABNORMAL HIGH (ref 65–99)
HEMATOCRIT: 46 % (ref 39.0–52.0)
HEMOGLOBIN: 15.6 g/dL (ref 13.0–17.0)
Potassium: 3.3 mmol/L — ABNORMAL LOW (ref 3.5–5.1)
Sodium: 143 mmol/L (ref 135–145)
TCO2: 22 mmol/L (ref 0–100)

## 2015-07-30 LAB — BASIC METABOLIC PANEL
ANION GAP: 11 (ref 5–15)
BUN: 12 mg/dL (ref 6–20)
CALCIUM: 8.9 mg/dL (ref 8.9–10.3)
CO2: 22 mmol/L (ref 22–32)
Chloride: 105 mmol/L (ref 101–111)
Creatinine, Ser: 0.88 mg/dL (ref 0.61–1.24)
GFR calc Af Amer: >60 mL/min (ref >60)
GFR calc non Af Amer: >60 mL/min (ref >60)
GLUCOSE: 108 mg/dL — AB (ref 65–99)
Potassium: 4 mmol/L (ref 3.5–5.1)
Sodium: 138 mmol/L (ref 135–145)

## 2015-07-30 LAB — CBC
HCT: 43 % (ref 39.0–52.0)
Hemoglobin: 14.5 g/dL (ref 13.0–17.0)
MCH: 31.1 pg (ref 26.0–34.0)
MCHC: 33.7 g/dL (ref 30.0–36.0)
MCV: 92.3 fL (ref 78.0–100.0)
Platelets: 229 10*3/uL (ref 150–400)
RBC: 4.66 MIL/uL (ref 4.22–5.81)
RDW: 13.9 % (ref 11.5–15.5)
WBC: 11 10*3/uL — ABNORMAL HIGH (ref 4.0–10.5)

## 2015-07-30 LAB — HEMOGLOBIN A1C
Hgb A1c MFr Bld: 5.9 % — ABNORMAL HIGH (ref 4.8–5.6)
MEAN PLASMA GLUCOSE: 123 mg/dL

## 2015-07-30 LAB — POCT ACTIVATED CLOTTING TIME: Activated Clotting Time: 417 seconds

## 2015-07-30 LAB — LIPID PANEL
CHOLESTEROL: 134 mg/dL (ref 0–200)
HDL: 35 mg/dL — ABNORMAL LOW (ref >40)
LDL CALC: 88 mg/dL (ref 0–99)
Total CHOL/HDL Ratio: 3.8 RATIO
Triglycerides: 53 mg/dL (ref <150)
VLDL: 11 mg/dL (ref 0–40)

## 2015-07-30 LAB — TROPONIN I
TROPONIN I: 23.06 ng/mL — AB (ref <0.031)
Troponin I: 19.95 ng/mL (ref <0.031)

## 2015-07-30 NOTE — Plan of Care (Signed)
Problem: Consults Goal: Tobacco Cessation referral if indicated Outcome: Not Applicable Date Met:  43/27/55 Pt does not smoke

## 2015-07-30 NOTE — Progress Notes (Addendum)
SUBJECTIVE:  No CP  OBJECTIVE:   Vitals:   Filed Vitals:   07/30/15 0500 07/30/15 0600 07/30/15 0700 07/30/15 0708  BP: 101/74 113/72 115/64 121/68  Pulse: 45 40 46 45  Temp:    97.8 F (36.6 C)  TempSrc:    Oral  Resp: Height:      Weight:      SpO2: 97% 97% 97% 97%   I&O's:   Intake/Output Summary (Last 24 hours) at 07/30/15 1013 Last data filed at 07/30/15 0500  Gross per 24 hour  Intake    915 ml  Output   1800 ml  Net   -885 ml   TELEMETRY: Reviewed telemetry pt in NSR, brief runs of NSVT:     PHYSICAL EXAM General: Well developed, well nourished, in no acute distress Head:   Normal cephalic and atramatic  Lungs:   Clear bilaterally to auscultation. Heart:   HRRR S1 S2  No JVD.   Abdomen: abdomen soft and non-tender Msk:  Back normal,  Normal strength and tone for age. Extremities:   No edema.  Bruising at right radial site.  2+ right radial pulse Neuro: Alert and oriented. Psych:  Normal affect, responds appropriately Skin: No rash   LABS: Basic Metabolic Panel:  Recent Labs  69/62/95 1915 07/30/15 0210  NA 141 138  K 3.8 4.0  CL 107 105  CO2 24 22  GLUCOSE 118* 108*  BUN 8 12  CREATININE 0.85 0.88  CALCIUM 9.3 8.9  MG 1.8  --    Liver Function Tests:  Recent Labs  07/29/15 1915  AST 55*  ALT 39  ALKPHOS 53  BILITOT 1.2  PROT 7.1  ALBUMIN 3.9   No results for input(s): LIPASE, AMYLASE in the last 72 hours. CBC:  Recent Labs  07/29/15 1915 07/30/15 0210  WBC 14.8* 11.0*  NEUTROABS 13.2*  --   HGB 15.1 14.5  HCT 44.0 43.0  MCV 90.3 92.3  PLT 272 229   Cardiac Enzymes:  Recent Labs  07/29/15 1915 07/30/15 0210  TROPONINI 2.40* 23.06*   BNP: Invalid input(s): POCBNP D-Dimer: No results for input(s): DDIMER in the last 72 hours. Hemoglobin A1C:  Recent Labs  07/29/15 1918  HGBA1C 5.9*   Fasting Lipid Panel:  Recent Labs  07/30/15 0210  CHOL 134  HDL 35*  LDLCALC 88  TRIG 53  CHOLHDL 3.8     Thyroid Function Tests:  Recent Labs  07/29/15 1915  TSH 1.070   Anemia Panel: No results for input(s): VITAMINB12, FOLATE, FERRITIN, TIBC, IRON, RETICCTPCT in the last 72 hours. Coag Panel:   Lab Results  Component Value Date   INR 1.50* 07/29/2015   INR 1.03 12/09/2014    RADIOLOGY: Portable Chest X-ray 1 View  07/29/2015  CLINICAL DATA:  ST-elevation myocardial infarction EXAM: PORTABLE CHEST 1 VIEW COMPARISON:  None. FINDINGS: Mild cardiac silhouette enlargement. Vascular pattern normal. Lungs clear. IMPRESSION: Mild enlargement of cardiac silhouette otherwise negative Electronically Signed   By: Esperanza Heir M.D.   On: 07/29/2015 19:27      ASSESSMENT: Donald Wood:   S/p inferior MI: Continue DAPT for at least a year.  COntinue aggressive secondary prevention. Nonsustained ventricular tachycardia likely related to recent ischemic event. In for wall hypokinesis. I personally reviewed the cath films. Hopefully, his Imdur wall motion will come back in the next few weeks. He will likely need an echocardiogram in 4-6 weeks to reassess wall motion.  Complex intervention  requiring buddy wires and guide liner for additional support.  Other moderate coronary artery disease to be managed medically.  LDL 88. This may be falsely low. Continue with high potency statin.  Blood pressure well controlled. Watch for any signs of hypertension.  Hemoglobin A1c normal.  Plan to transfer to telemetry today.  Potential discharge tomorrow if he is doing very well , but possibly Monday if he is not feeling up to discharge. It will depend on how he is feeling tomorrow. Currently, he feels well. I stressed the importance of dual antiplatelet therapy. He would benefit from cardiac rehabilitation and aggressive risk factor modification including weight loss for obesity.    Corky CraftsJayadeep S Darika Ildefonso, MD  07/30/2015  10:13 AM

## 2015-07-30 NOTE — Progress Notes (Signed)
  Echocardiogram 2D Echocardiogram has been performed.  Donald Wood, Ishaq M 07/30/2015, 12:05 PM

## 2015-07-30 NOTE — Progress Notes (Signed)
CARDIAC REHAB PHASE I   PRE:  Rate/Rhythm: SB 49 t wave inversion  BP:  Supine:   Sitting: 131/70  Standing:    SaO2: 98 RA  MODE:  Ambulation:350  ft   POST:  Rate/Rhythm: SB 55  BP:  Supine:   Sitting: 146/80  Standing:    SaO2: 99 RA   Pt hopeful for discharge on tomorrow.  Education completed at bedside regarding heart healthy eating, exercise guidelines, risk factors, NTG protocol and when to alert 911.  Handouts provided and pt given his stent card. Pt given information regarding outpatient cardiac rehab proram phase II.  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  Pt [facility]. Received verbal ok to ambulate pt in hallway.  Pt assisted up to ambulate 350 feet x 1 assist.  Pt tolerated well no complaints of chest pain or sob did complain of general stiffness particular his right knee where he had replacement earlier this year. Pt back to bed.  Echo tech in to perform Echo at the bedside. Sister in law at the bedside.  No complaints at this time. Alanson Alyarlette Maitland Lesiak RN, BSN [phone]

## 2015-07-30 NOTE — Progress Notes (Signed)
Dr. Virgina OrganQureshi returned call with no new orders. [facility] Michaelyn Wall [employer]

## 2015-07-30 NOTE — Progress Notes (Signed)
MD paged due to pt had 5 beat run vtach. He was asleep , asymptomatic and denies any pain or symptoms. Metoprolol was held at this time due to heart rate 47. B/P= 129/65 .Will continue to monitor. [facility] Charlot Gouin [employer]

## 2015-07-31 ENCOUNTER — Encounter (HOSPITAL_COMMUNITY): Payer: Self-pay | Admitting: Cardiology

## 2015-07-31 DIAGNOSIS — I2510 Atherosclerotic heart disease of native coronary artery without angina pectoris: Secondary | ICD-10-CM | POA: Diagnosis present

## 2015-07-31 DIAGNOSIS — E785 Hyperlipidemia, unspecified: Secondary | ICD-10-CM

## 2015-07-31 HISTORY — DX: Hyperlipidemia, unspecified: E78.5

## 2015-07-31 LAB — BASIC METABOLIC PANEL
Anion gap: 12 (ref 5–15)
BUN: 17 mg/dL (ref 6–20)
CALCIUM: 8.7 mg/dL — AB (ref 8.9–10.3)
CHLORIDE: 106 mmol/L (ref 101–111)
CO2: 20 mmol/L — AB (ref 22–32)
CREATININE: 0.88 mg/dL (ref 0.61–1.24)
GFR calc non Af Amer: >60 mL/min (ref >60)
GLUCOSE: 94 mg/dL (ref 65–99)
Potassium: 4.5 mmol/L (ref 3.5–5.1)
Sodium: 138 mmol/L (ref 135–145)

## 2015-07-31 MED ORDER — TICAGRELOR 90 MG PO TABS: 90.0000 mg | ORAL_TABLET | Freq: Two times a day (BID) | ORAL | Status: DC

## 2015-07-31 MED ORDER — METOPROLOL SUCCINATE ER 25 MG PO TB24: 12.5000 mg | ORAL_TABLET | Freq: Two times a day (BID) | ORAL | Status: DC

## 2015-07-31 MED ORDER — ATORVASTATIN CALCIUM 80 MG PO TABS: 80.0000 mg | ORAL_TABLET | Freq: Every day | ORAL | Status: DC

## 2015-07-31 MED ORDER — METOPROLOL SUCCINATE ER 25 MG PO TB24
25.0000 mg | ORAL_TABLET | Freq: Two times a day (BID) | ORAL | Status: DC
  Administered 2015-07-31: 25 mg via ORAL
  Filled 2015-07-31: qty 1

## 2015-07-31 MED ORDER — ASPIRIN 81 MG PO CHEW
81.0000 mg | CHEWABLE_TABLET | Freq: Every day | ORAL | Status: DC
Start: 2015-07-31 — End: 2015-12-02

## 2015-07-31 MED ORDER — LISINOPRIL 10 MG PO TABS: 10.0000 mg | ORAL_TABLET | Freq: Every day | ORAL | Status: DC

## 2015-07-31 NOTE — Care Management Note (Signed)
Case Management Note  Patient Details  Name: Donald Wood MRN: [id number] Date of Birth: [birth year]  Subjective/Objective:                  Admission Diagnoses: Inferior STEMI  Action/Plan: CM spoke to patient at the bedside and gave pt [employer] discount card. CM explained the 30 day free with $18 copay for commercial insurance. Pt states that he lives at home with his spouse and has no further needs or questions for discharge. Cm remains available for further needs or questions.   Expected Discharge Date:  07/31/15               Expected Discharge Plan:  Home/Self Care  In-House Referral:     Discharge planning Services  CM Consult, Medication Assistance  Post Acute Care Choice:    Choice offered to:     DME Arranged:    DME Agency:     HH Arranged:    HH Agency:     Status of Service:  Completed, signed off  Medicare Important Message Given:    Date Medicare IM Given:    Medicare IM give by:    Date Additional Medicare IM Given:    Additional Medicare Important Message give by:     If discussed at Long Length of Stay Meetings, dates discussed:    Additional Comments:  Darcel SmallingAnna C Kambrea Carrasco, RN 07/31/2015, 10:23 AM

## 2015-07-31 NOTE — Discharge Summary (Signed)
Physician Discharge Summary  Patient ID: Donald Wood MRN: [id number] DOB/AGE: [birth year] 59 y.o.   Primary Cardiologist: Dr. Clifton Teron  Admit date: 07/29/2015 Discharge date: 07/31/2015  Admission Diagnoses: Inferior STEMI  Discharge Diagnoses:  Active Problems:   ST elevation myocardial infarction (STEMI) of inferior wall (HCC)   Essential hypertension   Hyperglycemia   Obesity   CAD (coronary artery disease), native coronary artery   Hyperlipidemia   Discharged Condition: stable  Hospital Course: 59 y/o male with h/o hyperglycemia but no other significant PMH admitted to [facility] on 07/29/15 as a Code STEMI.   He was mowing his yard around 3pm 10/28 when he developed substernal chest pain associated with diaphoresis. No nausea, vomiting, syncope or dyspnea. Symptoms persisted so EMS was called. He was found to be hypertensive at 171/101, HR 55. EKG with inferior ST elevations. He was given  ASA, 1 SL NTG, and  of morphine with reduction in pain down to 4/10. He was brought by EMS emergently to [facility] cath lab. Troponin peaked at 23.   The procedure was performed by Dr. Clifton Phil. He was found to have an occluded mid RCA successfully treated with PCI + DES. There was also residual moderate disease in the Ramus intermediate branch and the distal LAD, to be treated medically.  He left the cath lab in stable condition and had no recurrent CP. He was placed on DAPT with ASA + Brilinta. Also placed on BB, ACE-I and high dose statin therapy. A 2D echo was obtained revealing normal LVEF of 55-60%. No WMA. He had no post cath complications. His right radial cath site remained stable. Renal function also remained stable. He did have mild bradycardia but remained asymptomatic. He was continued on low dose sustained metoprolol, 12.5 mg daily, and given instruction to check his pulse prior to taking his meds and to hold if less than 60 bpm.  He was last seen and examined by Dr. Donnie Aho who  determined he was stable for discharge home. He will f/u in 2 weeks with Dr. Clifton Meliton or an APP.   Consults: None  Significant Diagnostic Studies:  LHC 07/29/15 Procedures    Coronary Stent Intervention   Left Heart Cath and Coronary Angiography    Conclusion     Mid RCA to Dist RCA lesion, 70% stenosed.  Mid RCA lesion, 100% stenosed. 2 overlapping drug eluting stents placed in the mid RCA. Post intervention, there is a 0% residual stenosis.  Prox RCA lesion, 30% stenosed.  Mid Cx lesion, 30% stenosed.  Ramus lesion, 60% stenosed.  Dist LAD lesion, 60% stenosed.  1st Diag lesion, 30% stenosed.  The left ventricular systolic function is preserved with subtle hypokinesis of the inferior wall.  1. Acute inferior STEMI secondary to occluded mid RCA 2. Successful PTCA/DES x 2 mid RCA 3. Moderate disease in the Ramus intermediate branch and the distal LAD 4. Overall preserved LV systolic function with subtle inferior wall hypokinesis.      2D Echo 07/30/15  Study Conclusions  - Left ventricle: The cavity size was normal. Wall thickness was increased in a pattern of moderate LVH. Systolic function was normal. The estimated ejection fraction was in the range of 55% to 60%. GLPSS is innaccurate due to poor endocardial definition. Wall motion was normal; there were no regional wall motion abnormalities. The study is not technically sufficient to allow evaluation of LV diastolic function. - Left atrium: The atrium was normal in size.  Impressions:  - LVEF 55-60%, moderate  LVH, normal wall motion, cannot assess diastolic function, normal LA size.      Cardiac Panel (last 3 results)  Recent Labs  07/29/15 1915 07/30/15 0210 07/30/15 1104  TROPONINI 2.40* 23.06* 19.95*   Lipid Panel     Component Value Date/Time   CHOL 134 07/30/2015 0210   TRIG 53 07/30/2015 0210   HDL 35* 07/30/2015 0210   CHOLHDL 3.8 07/30/2015 0210   VLDL 11  07/30/2015 0210   LDLCALC 88 07/30/2015 0210     Treatments: See Hospital Course  Discharge Exam: Blood pressure 134/67, pulse 51, temperature 97.4 F (36.3 C), temperature source Oral, resp. rate 17, height 5\' 7"  (1.702 m), weight 240 lb (108.863 kg), SpO2 99 %.   Disposition: 06-Home-Health Care Svc      Discharge Instructions    Amb Referral to Cardiac Rehabilitation    Complete by:  As directed   Diagnosis:   Myocardial Infarction PCI       Diet - low sodium heart healthy    Complete by:  As directed      Increase activity slowly    Complete by:  As directed             Medication List    TAKE these medications        aspirin 81 MG chewable tablet  Chew 1 tablet (81 mg total) by mouth daily.     atorvastatin 80 MG tablet  Commonly known as:  LIPITOR  Take 1 tablet (80 mg total) by mouth daily at 6 PM.     lisinopril 10 MG tablet  Commonly known as:  PRINIVIL  Take 1 tablet (10 mg total) by mouth daily.     metoprolol succinate 25 MG 24 hr tablet  Commonly known as:  TOPROL-XL  Take 0.5 tablets (12.5 mg total) by mouth 2 (two) times daily with a meal.     oxyCODONE-acetaminophen 5-325 MG tablet  Commonly known as:  ROXICET  Take 1-2 tablets by mouth every 4 (four) hours as needed.     ticagrelor 90 MG Tabs tablet  Commonly known as:  BRILINTA  Take 1 tablet (90 mg total) by mouth 2 (two) times daily.     ticagrelor 90 MG Tabs tablet  Commonly known as:  BRILINTA  Take 1 tablet (90 mg total) by mouth 2 (two) times daily.       Follow-up Information    Follow up with Verne CarrowMCALHANY,CHRISTOPHER, MD.   Specialty:  Cardiology   Why:  our office will call you with a follow-up appointment    Contact information:   [address] [place] [address] [phone]      TIME SPENT ON DISCHARGE, INCLUDING PHYSICIAN TIME: >30 MINUTES  Signed: Shavon Zenz 07/31/2015, 8:45 AM

## 2015-07-31 NOTE — Discharge Instructions (Signed)
Heart Attack A heart attack (myocardial infarction, MI) causes damage to your heart that cannot be fixed. A heart attack can happen when a heart (coronary) artery becomes blocked or narrowed. This cuts off the blood supply and oxygen to your heart. When one or more of your coronary arteries becomes blocked, that area of your heart begins to die. This causes the pain you feel during a heart attack. Heart attack pain can also occur in one part of the body but be felt in another part of the body (referred pain). You may feel referred heart attack pain in your left arm, neck, or jaw. Pain may even be felt in the right arm. CAUSES  Many conditions can cause a heart attack. These include:   Atherosclerosis. This is when a fatty substance (plaque) gradually builds up in the arteries. This buildup can block or reduce the blood supply to one or more of the heart arteries.  A blood clot. A blood clot can develop suddenly when plaque breaks up (ruptures) within a heart artery. A blood clot can block the heart artery, which prevents blood flow to the heart.   Severe tightening (spasm) of the heart artery. This cuts off blood flow through the artery.  RISK FACTORS People at risk for heart attack usually have one or more of the following risk factors:   High blood pressure (hypertension).  High cholesterol.  Smoking.  Being male.  Being overweight or obese.  Older aged.   A family history of heart disease.  Lack of exercise.  Diabetes.  Stress.  Drinking too much alcohol.  Using illegal street drugs, such as cocaine and methamphetamines. SYMPTOMS  Heart attack symptoms can vary from person to person. Symptoms depend on factors like gender and age.   In both men and women, heart attack symptoms can include the following:   Chest pain. This may feel like crushing, squeezing, or a feeling of pressure.  Shortness of breath.  Heartburn or indigestion with or without vomiting,  shortness of breath, or sweating.  Sudden cold sweats.  Sudden light-headedness.  Upper back pain.   Women can have unique heart attack symptoms, such as:   Unexplained feelings of nervousness or anxiety.  Discomfort between the shoulder blades or upper back.  Tingling in the hands and arms.   Older people (of both genders) can have subtle heart attack symptoms, such as:   Sweating.  Shortness of breath.  General tiredness or not feeling well.  DIAGNOSIS  Diagnosing a heart attack involves several tests. They include:   An assessment of your vital signs. This includes checking your:  Heart rhythm.  Blood pressure.  Breathing rate.  Oxygen level.   An ECG (electrocardiogram) to measure the electrical activity of your heart.  Blood tests called cardiac markers. In these tests, blood is drawn at scheduled times to check for the specific proteins or enzymes released by damaged heart muscle.  A chest X-ray.  An echocardiogram to evaluate heart motion and blood flow.  Coronary angiography to look at the heart arteries.  TREATMENT  Treatment for a heart attack may include:   Medicine that breaks up or dissolves blood clots in the heart artery.  Angioplasty.  Cardiac stent placement.  Intra-aortic balloon pump therapy (IABP).  Open heart surgery (coronary artery bypass graft, CABG). HOME CARE INSTRUCTIONS  Take medicines only as directed by your health care provider. You may need to take medicine after a heart attack to:   Keep your blood from   clotting too easily.  Control your blood pressure.  Lower your cholesterol.  Control abnormal heart rhythms.   Do not take the following medicines unless your health care provider approves:  Nonsteroidal anti-inflammatory drugs (NSAIDs), such as ibuprofen, naproxen, or celecoxib.  Vitamin supplements that contain vitamin A, vitamin E, or both.  Hormone replacement therapy that contains estrogen with or  without progestin.  Make lifestyle changes as directed by your health care provider. These may include:   Quitting smoking, if you smoke.  Getting regular exercise. Ask your health care provider to suggest some activities that are safe for you.  Eating a heart-healthy diet. A registered dietitian can help you learn healthy eating options.  Maintaining a healthy weight.   Managing diabetes, if necessary.  Reducing stress.  Limiting how much alcohol you drink. SEEK IMMEDIATE MEDICAL CARE IF:   You have sudden, unexplained chest discomfort.  You have sudden, unexplained discomfort in your arms, back, neck, or jaw.  You have shortness of breath at any time.  You suddenly start to sweat or your skin gets clammy.  You feel nauseous or vomit.  You suddenly feel light-headed or dizzy.  Your heart begins to beat fast or feels like it is skipping beats. These symptoms may represent a serious problem that is an emergency. Do not wait to see if the symptoms will go away. Get medical help right away. Call your local emergency services (911 in the [place] Do not drive yourself to the hospital.   This information is not intended to replace advice given to you by your health care provider. Make sure you discuss any questions you have with your health care provider.   Document Released: 09/17/2005 Document Revised: 10/08/2014 Document Reviewed: 11/20/2013 Elsevier Interactive Patient Education 2016 [employer]vier Inc.  

## 2015-07-31 NOTE — Progress Notes (Signed)
Patient Profile: 59 y/o male, admitted for inferior STEMI  Subjective: Feels great. Ready to go home. Denies any recurrent CP. No dyspnea. Ambulating w/o difficulty. Asymptomatic with is bradycardia.   Objective: Vital signs in last 24 hours: Temp:  [97.4 F (36.3 C)-98.2 F (36.8 C)] 97.4 F (36.3 C) (10/30 0500) Pulse Rate:  [44-54] 51 (10/30 0500) Resp:  [17-21] 17 (10/29 1654) BP: (120-146)/(63-80) 134/67 mmHg (10/30 0500) SpO2:  [97 %-99 %] 99 % (10/30 0500) Weight:  [240 lb (108.863 kg)] 240 lb (108.863 kg) (10/30 0500) Last BM Date: 07/30/15  Intake/Output from previous day: 10/29 0701 - 10/30 0700 In: 920 [P.O.:920] Out: 650 [Urine:650] Intake/Output this shift:    Medications Current Facility-Administered Medications  Medication Dose Route Frequency Provider Last Rate Last Dose  . 0.9 %  sodium chloride infusion  250 mL Intravenous PRN Kathleene Hazel, MD      . acetaminophen (TYLENOL) tablet 650 mg  650 mg Oral Q4H PRN Kathleene Hazel, MD      . aspirin chewable tablet 81 mg  81 mg Oral Daily Kathleene Hazel, MD   81 mg at 07/30/15 1203  . atorvastatin (LIPITOR) tablet 80 mg  80 mg Oral q1800 Dayna N Dunn, PA-C   80 mg at 07/30/15 1724  . metoprolol tartrate (LOPRESSOR) tablet 12.5 mg  12.5 mg Oral BID Kathleene Hazel, MD   12.5 mg at 07/30/15 1203  . morphine 2 MG/ML injection 2 mg  2 mg Intravenous Q1H PRN Kathleene Hazel, MD      . nitroGLYCERIN (NITROSTAT) SL tablet 0.4 mg  0.4 mg Sublingual Q5 Min x 3 PRN Dayna N Dunn, PA-C      . ondansetron (ZOFRAN) injection 4 mg  4 mg Intravenous Q6H PRN Kathleene Hazel, MD      . oxyCODONE-acetaminophen (PERCOCET/ROXICET) 5-325 MG per tablet 1-2 tablet  1-2 tablet Oral Q6H PRN Dayna N Dunn, PA-C      . sodium chloride 0.9 % injection 3 mL  3 mL Intravenous Q12H Kathleene Hazel, MD   3 mL at 07/30/15 1000  . sodium chloride 0.9 % injection 3 mL  3 mL Intravenous PRN  Kathleene Hazel, MD      . ticagrelor [facility]) tablet 90 mg  90 mg Oral BID Kathleene Hazel, MD   90 mg at 07/30/15 2144    PE: General appearance: alert, cooperative, no distress and moderately obese Neck: no carotid bruit and no JVD Lungs: clear to auscultation bilaterally Heart: regular rate and rhythm, S1, S2 normal, no murmur, click, rub or gallop Extremities: no LEE Pulses: 2+ and symmetric Skin: warm and dry Neurologic: Grossly normal  Lab Results:   Recent Labs  07/29/15 1717 07/29/15 1915 07/30/15 0210  WBC  --  14.8* 11.0*  HGB 15.6 15.1 14.5  HCT 46.0 44.0 43.0  PLT  --  272 229   BMET  Recent Labs  07/29/15 1915 07/30/15 0210 07/31/15 0446  NA 141 138 138  K 3.8 4.0 4.5  CL 107 105 106  CO2 24 22 20*  GLUCOSE 118* 108* 94  BUN CREATININE 0.85 0.88 0.88  CALCIUM 9.3 8.9 8.7*   PT/INR  Recent Labs  07/29/15 1915  LABPROT 18.1*  INR 1.50*   Cholesterol  Recent Labs  07/30/15 0210  CHOL 134   Cardiac Panel (last 3 results)  Recent Labs  07/29/15 1915 07/30/15 0210 07/30/15 1104  TROPONINI 2.40* 23.06* 19.95*    Studies/Results: [facility] 07/29/15 Procedures    Coronary Stent Intervention   Left Heart Cath and Coronary Angiography    Conclusion     Mid RCA to Dist RCA lesion, 70% stenosed.  Mid RCA lesion, 100% stenosed. 2 overlapping drug eluting stents placed in the mid RCA. Post intervention, there is a 0% residual stenosis.  Prox RCA lesion, 30% stenosed.  Mid Cx lesion, 30% stenosed.  Ramus lesion, 60% stenosed.  Dist LAD lesion, 60% stenosed.  1st Diag lesion, 30% stenosed.  The left ventricular systolic function is preserved with subtle hypokinesis of the inferior wall.  1. Acute inferior STEMI secondary to occluded mid RCA 2. Successful PTCA/DES x 2 mid RCA 3. Moderate disease in the Ramus intermediate branch and the distal LAD 4. Overall preserved LV systolic function with subtle  inferior wall hypokinesis.      2D Echo 07/30/15  Study Conclusions  - Left ventricle: The cavity size was normal. Wall thickness was increased in a pattern of moderate LVH. Systolic function was normal. The estimated ejection fraction was in the range of 55% to 60%. GLPSS is innaccurate due to poor endocardial definition. Wall motion was normal; there were no regional wall motion abnormalities. The study is not technically sufficient to allow evaluation of LV diastolic function. - Left atrium: The atrium was normal in size.  Impressions:  - LVEF 55-60%, moderate LVH, normal wall motion, cannot assess diastolic function, normal LA size.    Assessment/Plan  Active Problems:   ST elevation myocardial infarction (STEMI) of inferior wall (HCC)   Essential hypertension   Hyperglycemia   Hyperglycemia   Obesity   Acute ST elevation myocardial infarction (STEMI) (HCC)   1. Inferior STEMI: 2/2 occluded RCA, s/p PCI + DES to mid RCA . With residual moderate disease in the Ramus intermediate branch and the distal LAD, to be treated medically. EF preserved by echo at 55-60%. Wall motion was normal; there were no regional wall motion abnormalities.  He denies any recurrent CP. Continue DAPT with ASA + Brilinta for at least 1 year given DES. Continue aggressive secondary prevention with high dose statin. MD will decide on BB given resting bradycardia. May also consider adding low dose ACE-I. Recommend OP cardiac rehab.   2. DLD: LDL 88. This may be falsely low. Continue with high potency statin. HDL is low at 35. Recommend OP cardiac rehab to help increase physical activity.   3. HTN: BP stable in the 130s on metoprolol. Given CAD, may consider addition of a low dose ACE-I for secondary prevention. Renal function is stable.   4. Hyperglycemia: Hgb A1c is normal.   5. Bradycardia: resting bradycardia, currently in the 50s. As low as the 40s overnight. Metoprolol was held  last PM. Low dose ordered, 12.5 mg BID. He is asymptomatic, but will likely need to hold BB on discharge. Will defer to MD.  Dispo: possible d/c home today.     LOS: 2 days    Brittainy M. Delmer IslamSimmons, PA-C 07/31/2015 7:23 AM  Patient denies chest pain or shortness of breath today.  Ambulatory in hall.  Heart rate is currently 46 and has had bradycardia and beta blocker has been held.  Okay for discharge today but will need to check pulse at home and hold metoprolol if pulse is less than 60.  Would send home on sustained-release metoprolol 12.5 mg daily for the time being and a light of the bradycardia.  Has slight ecchymosis  at cath site, lungs clear no S3.  I would also discharge home on lisinopril 10 mg daily for hypertension and secondary prevention.  Follow-up with Dr. Sanjuana Kava or in the clinic in one to 2 weeks.  Darden Palmer MD [facility]

## 2015-08-01 ENCOUNTER — Encounter (HOSPITAL_COMMUNITY): Payer: Self-pay | Admitting: Cardiovascular Disease

## 2015-08-02 ENCOUNTER — Telehealth: Payer: Self-pay | Admitting: Cardiovascular Disease

## 2015-08-02 NOTE — Telephone Encounter (Signed)
°  New Message   Pt wife is calling and she wants to know di he return to work or do he stay out of work  Discharge instructions states no restrictions and they are confused on that pt should do

## 2015-08-02 NOTE — Telephone Encounter (Signed)
°

## 2015-08-02 NOTE — Telephone Encounter (Signed)
Spoke with pt's wife and told her pt should stay out of work until he sees Jacolyn ReedyMichele Lenze, [place] on 08/15/15.  I told her return to work date would be determined at that appt.

## 2015-08-15 ENCOUNTER — Ambulatory Visit (INDEPENDENT_AMBULATORY_CARE_PROVIDER_SITE_OTHER): Payer: 59 | Admitting: Physician Assistant

## 2015-08-15 ENCOUNTER — Encounter: Payer: Self-pay | Admitting: Physician Assistant

## 2015-08-15 VITALS — BP 180/82 | HR 43 | Ht 67.0 in | Wt 240.0 lb

## 2015-08-15 DIAGNOSIS — R0989 Other specified symptoms and signs involving the circulatory and respiratory systems: Secondary | ICD-10-CM | POA: Insufficient documentation

## 2015-08-15 DIAGNOSIS — I2510 Atherosclerotic heart disease of native coronary artery without angina pectoris: Secondary | ICD-10-CM | POA: Diagnosis not present

## 2015-08-15 DIAGNOSIS — E785 Hyperlipidemia, unspecified: Secondary | ICD-10-CM | POA: Diagnosis not present

## 2015-08-15 DIAGNOSIS — R001 Bradycardia, unspecified: Secondary | ICD-10-CM

## 2015-08-15 MED ORDER — METOPROLOL SUCCINATE ER 25 MG PO TB24: 12.5000 mg | ORAL_TABLET | Freq: Every day | ORAL | Status: DC

## 2015-08-15 MED ORDER — LISINOPRIL 20 MG PO TABS: 20.0000 mg | ORAL_TABLET | Freq: Every day | ORAL | Status: DC

## 2015-08-15 NOTE — Assessment & Plan Note (Signed)
Check fasting lipid panel and LFTs in one month  

## 2015-08-15 NOTE — Progress Notes (Signed)
Cardiology Office Note   Date:  08/15/2015   ID:  Donald Wood, DOB September 24, 1956, MRN [id number]  PCP:  No primary care provider on file.  Cardiologist: Dr. Clifton Sriansh   Chief Complaint: Post hospital follow-up    History of Present Illness: Donald Wood is a 59 y.o. male who presents for hospital follow-up. He had STEMI 07/28/15 treated with PCI and DES x 2 of an occluded mid RCA. There is also residual moderate disease in the ramus intermediate branch and the distal LAD to be treated medically. 2-D echo showed an EF of 55-60% with no wall motion abnormality. He had mild bradycardia but remained asymptomatic. He was continued on low dose metoprolol.  Has history of arthritis status post knee surgery 11/2014 and hyperglycemia, untreated hypertension.  Patient feeling much better. Walking 1/2 mile daily without chest tightness, dyspnea, dizziness or presyncope. 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 He rides a Electrical engineer. The heaviest thing he carries is a leaf blower which she's been using a home without any difficulty. His heart rate is 43 today. He has not been checking his pulse at home. He was supposed to hold his metoprolol for his heart rate was less than 60. He denies any dizziness or presyncope.    Past Medical History  Diagnosis Date  . Arthritis   . Hyperglycemia   . Obesity   . CAD (coronary artery disease), native coronary artery     07/29/15 Inferior infarct Cath showed normal left main, 30% diagonal, 60% distal LAD, 30% circumflex, occluded right coronary artery 4.0 x 38 mm and 4.0 x 16 mm Synergy drug-eluting stents to right coronary artery Dr. Sanjuana Kava   . Hyperlipidemia 07/31/2015    Past Surgical History  Procedure Laterality Date  . Total knee arthroplasty Left 12/17/2014    Procedure: LEFT TOTAL KNEE ARTHROPLASTY;  Surgeon: Kathryne Hitch, MD;  Location: [facility];  Service: Orthopedics;  Laterality: Left;  . Cardiac catheterization  N/A 07/29/2015    Procedure: Left Heart Cath and Coronary Angiography;  Surgeon: Kathleene Hazel, MD;  Location: [facility] INVASIVE CV LAB;  Service: Cardiovascular;  Laterality: N/A;  . Cardiac catheterization N/A 07/29/2015    Procedure: Coronary Stent Intervention;  Surgeon: Kathleene Hazel, MD;  Location: [facility] INVASIVE CV LAB;  Service: Cardiovascular;  Laterality: N/A;     Current Outpatient Prescriptions  Medication Sig Dispense Refill  . aspirin 81 MG chewable tablet Chew 1 tablet (81 mg total) by mouth daily.    Marland Kitchen atorvastatin (LIPITOR) 80 MG tablet Take 1 tablet (80 mg total) by mouth daily at 6 PM. 30 tablet 5  . lisinopril (PRINIVIL) 10 MG tablet Take 1 tablet (10 mg total) by mouth daily. 30 tablet 5  . metoprolol succinate (TOPROL-XL) 25 MG 24 hr tablet Take 0.5 tablets (12.5 mg total) by mouth 2 (two) times daily with a meal. 30 tablet 5  . ticagrelor (BRILINTA) 90 MG TABS tablet Take 1 tablet (90 mg total) by mouth 2 (two) times daily. 60 tablet 10   No current facility-administered medications for this visit.    Allergies:   Review of patient's allergies indicates no known allergies.    Social History:  The patient  reports that he has never smoked. He does not have any smokeless tobacco history on file. He reports that he drinks alcohol. He reports that he does not use illicit drugs.   Family History:  The patient's   family history  includes CAD in his mother; Heart attack in his father; Hypertension in his father; Stroke in his father.    ROS:  Please see the history of present illness.   Otherwise, review of systems are positive for none.   All other systems are reviewed and negative.    PHYSICAL EXAM: VS:  BP 180/82 mmHg  Pulse 43  Ht 5\' 7"  (1.702 m)  Wt 240 lb (108.863 kg)  BMI 37.58 kg/m2 , BMI Body mass index is 37.58 kg/(m^2). GEN: Well nourished, well developed, in no acute distress Neck: Loud carotid bruits no JVD, HJR, or masses Cardiac: RRR; has  an S4, 2/6 systolic murmur at the left sternal border, no rubs, thrill or heave,  Respiratory:  clear to auscultation bilaterally, normal work of breathing GI: soft, nontender, nondistended, + BS MS: no deformity or atrophy Extremities: without cyanosis, clubbing, edema, good distal pulses bilaterally.  Skin: warm and dry, no rash Neuro:  Strength and sensation are intact    EKG:  EKG is ordered today. The ekg ordered today demonstrates sinus bradycardia at 43 bpm with T-wave inversion inferior laterally   Recent Labs: 07/29/2015: ALT 39; Magnesium 1.8; TSH 1.070 07/30/2015: Hemoglobin 14.5; Platelets 229 07/31/2015: BUN 17; Creatinine, Ser 0.88; Potassium 4.5; Sodium 138    Lipid Panel    Component Value Date/Time   CHOL 134 07/30/2015 0210   TRIG 53 07/30/2015 0210   HDL 35* 07/30/2015 0210   CHOLHDL 3.8 07/30/2015 0210   VLDL 11 07/30/2015 0210   LDLCALC 88 07/30/2015 0210      Wt Readings from Last 3 Encounters:  08/15/15 240 lb (108.863 kg)  07/31/15 240 lb (108.863 kg)  12/17/14 237 lb (107.502 kg)      Other studies Reviewed: Additional studies/ records that were reviewed today include and review of the records demonstrates: 2D Echo 07/30/15  Study Conclusions  - Left ventricle: The cavity size was normal. Wall thickness was   increased in a pattern of moderate LVH. Systolic function was   normal. The estimated ejection fraction was in the range of 55%   to 60%. GLPSS is innaccurate due to poor endocardial definition.   Wall motion was normal; there were no regional wall motion   abnormalities. The study is not technically sufficient to allow   evaluation of LV diastolic function. - Left atrium: The atrium was normal in size.  Impressions:  - LVEF 55-60%, moderate LVH, normal wall motion, cannot assess   diastolic function, normal LA size.      LHC 07/29/15 Procedures        Coronary Stent Intervention        Left Heart Cath and Coronary  Angiography         Conclusion         Mid RCA to Dist RCA lesion, 70% stenosed.  Mid RCA lesion, 100% stenosed. 2 overlapping drug eluting stents placed in the mid RCA. Post intervention, there is a 0% residual stenosis.  Prox RCA lesion, 30% stenosed.  Mid Cx lesion, 30% stenosed.  Ramus lesion, 60% stenosed.  Dist LAD lesion, 60% stenosed.  1st Diag lesion, 30% stenosed.  The left ventricular systolic function is preserved with subtle hypokinesis of the inferior wall.   1. Acute inferior STEMI secondary to occluded mid RCA 2. Successful PTCA/DES x 2 mid RCA 3. Moderate disease in the Ramus intermediate branch and the distal LAD 4. Overall preserved LV systolic function with subtle inferior wall hypokinesis.  ASSESSMENT AND PLAN: CAD (coronary artery disease), native coronary artery Patient suffered an inferior wall MI treated with DES 2 to the RCA. He has residual LAD and circumflex disease treated medically. He is bradycardic today at 43 bpm. I will decrease his metoprolol Susann 8 to half a tablet daily. He is to check his heart rates at home. If his heart rate is less than 55 he should hold the metoprolol. Continue Brilinta aspirin and Lipitor. He may return to work next week half days for 1 week and then full time. Follow-up for blood pressure and heart rate check in 2 weeks. Follow-up with Dr.McAllhany in 2 months.  Essential hypertension Blood pressure is elevated today. Increase lisinopril to 20 mg daily. 2 g sodium diet.  Hyperlipidemia Check fasting lipid panel and LFTs in one month  Sinus bradycardia Patient's heart rate is 43 today. We'll decrease metoprolol to half a tablet daily. He is to hold for heart rates less than 55. I will see him back in 2 weeks.  Bilateral carotid bruits Patient has bilateral carotid bruits. Will check Dopplers.     Elson Clan, PA-C  08/15/2015 10:05 AM    [facility] [address] [place], [place], [place]  [address] Phone: [phone]; Fax: [phone]

## 2015-08-15 NOTE — Assessment & Plan Note (Signed)
Patient's heart rate is 43 today. We'll decrease metoprolol to half a tablet daily. He is to hold for heart rates less than 55. I will see him back in 2 weeks.

## 2015-08-15 NOTE — Assessment & Plan Note (Signed)
Patient has bilateral carotid bruits. Will check Dopplers.

## 2015-08-15 NOTE — Assessment & Plan Note (Signed)
Patient suffered an inferior wall MI treated with DES 2 to the RCA. He has residual LAD and circumflex disease treated medically. He is bradycardic today at 43 bpm. I will decrease his metoprolol Susann 8 to half a tablet daily. He is to check his heart rates at home. If his heart rate is less than 55 he should hold the metoprolol. Continue Brilinta aspirin and Lipitor. He may return to work next week half days for 1 week and then full time. Follow-up for blood pressure and heart rate check in 2 weeks. Follow-up with Dr.McAllhany in 2 months.

## 2015-08-15 NOTE — Patient Instructions (Addendum)
Medication Instructions:  Decrease Metoprolol to 12.5 mg once daily (please do not take if your heart rate is 55 or below) and increase Lisinopril to 20 mg daily-all other medications remain the same.  Labwork: Lipids and Liver Function in 1 month. Please do not eat or drink after midnight the night before labs are drawn.  Testing/Procedures: Your physician has requested that you have a carotid duplex. This test is an ultrasound of the carotid arteries in your neck. It looks at blood flow through these arteries that supply the brain with blood. Allow one hour for this exam. There are no restrictions or special instructions.   Follow-Up: Your physician recommends that you schedule a follow-up appointment in: 2 to 3 weeks with Herma CarsonMichelle Lenze, PA and in 2 months with Dr Sanjuana KavaMcAlhaney.      If you need a refill on your cardiac medications before your next appointment, please call your pharmacy.

## 2015-08-15 NOTE — Assessment & Plan Note (Signed)
Blood pressure is elevated today. Increase lisinopril to 20 mg daily. 2 g sodium diet.

## 2015-08-19 ENCOUNTER — Ambulatory Visit (HOSPITAL_COMMUNITY)
Admission: RE | Admit: 2015-08-19 | Discharge: 2015-08-19 | Disposition: A | Discharge status: Home / Self Care | Payer: 59 | Source: Ambulatory Visit | Attending: Cardiovascular Disease | Admitting: Cardiovascular Disease

## 2015-08-19 DIAGNOSIS — I6523 Occlusion and stenosis of bilateral carotid arteries: Secondary | ICD-10-CM | POA: Diagnosis not present

## 2015-08-19 DIAGNOSIS — R0989 Other specified symptoms and signs involving the circulatory and respiratory systems: Secondary | ICD-10-CM | POA: Insufficient documentation

## 2015-08-19 DIAGNOSIS — E785 Hyperlipidemia, unspecified: Secondary | ICD-10-CM | POA: Insufficient documentation

## 2015-09-06 ENCOUNTER — Encounter: Payer: Self-pay | Admitting: Physician Assistant

## 2015-09-06 ENCOUNTER — Ambulatory Visit (INDEPENDENT_AMBULATORY_CARE_PROVIDER_SITE_OTHER): Payer: 59 | Admitting: Physician Assistant

## 2015-09-06 VITALS — BP 170/98 | HR 78 | Ht 67.0 in | Wt 245.0 lb

## 2015-09-06 DIAGNOSIS — R001 Bradycardia, unspecified: Secondary | ICD-10-CM

## 2015-09-06 DIAGNOSIS — E785 Hyperlipidemia, unspecified: Secondary | ICD-10-CM | POA: Diagnosis not present

## 2015-09-06 DIAGNOSIS — R0989 Other specified symptoms and signs involving the circulatory and respiratory systems: Secondary | ICD-10-CM

## 2015-09-06 DIAGNOSIS — I10 Essential (primary) hypertension: Secondary | ICD-10-CM | POA: Diagnosis not present

## 2015-09-06 MED ORDER — LISINOPRIL 40 MG PO TABS: 40.0000 mg | ORAL_TABLET | Freq: Every day | ORAL | Status: DC

## 2015-09-06 NOTE — Progress Notes (Signed)
Cardiology Office Note   Date:  09/06/2015   ID:  WOODSON MACHA, DOB 01-21-1956, MRN [id number]  PCP:  No PCP Per Patient  Cardiologist:  Dr. Clifton Watson  Chief Complaint:     History of Present Illness: Donald Wood is a 59 y.o. male who presents for three-week follow-up. He had STEMI 07/28/15 treated with PCI and DES x 2 of an occluded mid RCA. There is also residual moderate disease in the ramus intermediate branch and the distal LAD to be treated medically. 2-D echo showed an EF of 55-60% with no wall motion abnormality. He had mild bradycardia but remained asymptomatic. He was continued on low dose metoprolol.  Has history of arthritis status post knee surgery 11/2014 and hyperglycemia, untreated hypertension.  I saw the patient on 08/15/15 and he was bradycardic at 43 bpm. I decrease his metoprolol to 12.5 mg tablet daily. He was advised to check his heart rate at home and if less than 55 hold his metoprolol. I also increased his lisinopril to 20 mg for hypertension He was to continue his Brilinta aspirin and Lipitor. Carotid Dopplers were checked and were stable, repeat in 2 years. He is here today for follow-up heart rate and blood pressure check.  Patient actually stopped his metoprolol instead of taking a half a tablet daily. His blood pressure is high today. It has been elevated. He is trying to watch his salt but he says it's difficult. He ate [employer] biscuit this morning.    Past Medical History  Diagnosis Date  . Arthritis   . Hyperglycemia   . Obesity   . CAD (coronary artery disease), native coronary artery     07/29/15 Inferior infarct Cath showed normal left main, 30% diagonal, 60% distal LAD, 30% circumflex, occluded right coronary artery 4.0 x 38 mm and 4.0 x 16 mm Synergy drug-eluting stents to right coronary artery Dr. Sanjuana Kava   . Hyperlipidemia 07/31/2015    Past Surgical History  Procedure Laterality Date  . Total knee arthroplasty Left 12/17/2014     Procedure: LEFT TOTAL KNEE ARTHROPLASTY;  Surgeon: Kathryne Hitch, MD;  Location: [facility];  Service: Orthopedics;  Laterality: Left;  . Cardiac catheterization N/A 07/29/2015    Procedure: Left Heart Cath and Coronary Angiography;  Surgeon: Kathleene Hazel, MD;  Location: [facility] INVASIVE CV LAB;  Service: Cardiovascular;  Laterality: N/A;  . Cardiac catheterization N/A 07/29/2015    Procedure: Coronary Stent Intervention;  Surgeon: Kathleene Hazel, MD;  Location: [facility] INVASIVE CV LAB;  Service: Cardiovascular;  Laterality: N/A;     Current Outpatient Prescriptions  Medication Sig Dispense Refill  . aspirin 81 MG chewable tablet Chew 1 tablet (81 mg total) by mouth daily.    Marland Kitchen atorvastatin (LIPITOR) 80 MG tablet Take 1 tablet (80 mg total) by mouth daily at 6 PM. 30 tablet 5  . lisinopril (PRINIVIL,ZESTRIL) 20 MG tablet Take 1 tablet (20 mg total) by mouth daily. 30 tablet 5  . ticagrelor (BRILINTA) 90 MG TABS tablet Take 1 tablet (90 mg total) by mouth 2 (two) times daily. 60 tablet 10   No current facility-administered medications for this visit.    Allergies:   Review of patient's allergies indicates no known allergies.    Social History:  The patient  reports that he has never smoked. He does not have any smokeless tobacco history on file. He reports that he drinks alcohol. He reports that he does not use illicit drugs.   Family History:  The patient's   family history includes CAD in his mother; Heart attack in his father; Hypertension in his father; Stroke in his father.    ROS:  Please see the history of present illness.   Otherwise, review of systems are positive for none.   All other systems are reviewed and negative.    PHYSICAL EXAM: VS:  BP 170/98 mmHg  Pulse 78  Ht 5\' 7"  (1.702 m)  Wt 245 lb (111.131 kg)  BMI 38.36 kg/m2  SpO2 96% , BMI Body mass index is 38.36 kg/(m^2). GEN: Well nourished, well developed, in no acute distress Neck: Bilateral carotid  bruits no JVD, HJR,  or masses Cardiac:  RRR; 2/6 systolic murmur at the left sternal border, positive S4, no rubs, thrill or heave,  Respiratory:  clear to auscultation bilaterally, normal work of breathing GI: soft, nontender, nondistended, + BS MS: no deformity or atrophy Extremities: without cyanosis, clubbing, edema, good distal pulses bilaterally.  Skin: warm and dry, no rash Neuro:  Strength and sensation are intact    EKG:  EKG is ordered today. The ekg ordered today demonstrates    Recent Labs: 07/29/2015: ALT 39; Magnesium 1.8; TSH 1.070 07/30/2015: Hemoglobin 14.5; Platelets 229 07/31/2015: BUN 17; Creatinine, Ser 0.88; Potassium 4.5; Sodium 138    Lipid Panel    Component Value Date/Time   CHOL 134 07/30/2015 0210   TRIG 53 07/30/2015 0210   HDL 35* 07/30/2015 0210   CHOLHDL 3.8 07/30/2015 0210   VLDL 11 07/30/2015 0210   LDLCALC 88 07/30/2015 0210      Wt Readings from Last 3 Encounters:  09/06/15 245 lb (111.131 kg)  08/15/15 240 lb (108.863 kg)  07/31/15 240 lb (108.863 kg)      Other studies Reviewed: Additional studies/ records that were reviewed today include and review of the records demonstrates:  2D Echo 07/30/15  Study Conclusions  - Left ventricle: The cavity size was normal. Wall thickness was   increased in a pattern of moderate LVH. Systolic function was   normal. The estimated ejection fraction was in the range of 55%   to 60%. GLPSS is innaccurate due to poor endocardial definition.   Wall motion was normal; there were no regional wall motion   abnormalities. The study is not technically sufficient to allow   evaluation of LV diastolic function. - Left atrium: The atrium was normal in size.  Impressions:  - LVEF 55-60%, moderate LVH, normal wall motion, cannot assess   diastolic function, normal LA size.      LHC 07/29/15 Procedures        Coronary Stent Intervention        Left Heart Cath and Coronary Angiography          Conclusion         Mid RCA to Dist RCA lesion, 70% stenosed.  Mid RCA lesion, 100% stenosed. 2 overlapping drug eluting stents placed in the mid RCA. Post intervention, there is a 0% residual stenosis.  Prox RCA lesion, 30% stenosed.  Mid Cx lesion, 30% stenosed.  Ramus lesion, 60% stenosed.  Dist LAD lesion, 60% stenosed.  1st Diag lesion, 30% stenosed.  The left ventricular systolic function is preserved with subtle hypokinesis of the inferior wall.   1. Acute inferior STEMI secondary to occluded mid RCA 2. Successful PTCA/DES x 2 mid RCA 3. Moderate disease in the Ramus intermediate branch and the distal LAD 4. Overall preserved LV systolic function with subtle inferior wall hypokinesis.  ASSESSMENT AND PLAN:  CAD (coronary artery disease), native coronary artery Doing well after STEMI and successful DES 2 to the mid RCA. No chest pain.  Essential hypertension Blood pressure is high today. Increase lisinopril to 40 mg daily. Reiterated the importance of a low sodium diet. Come back in 2 weeks for blood pressure check with myself or pharmacy.  Sinus bradycardia I asked the patient to decrease his metoprolol to 12.5 mg daily and keep a close sinus heart rate. There was a misunderstanding and he stopped his metoprolol altogether. His heart rate is 76 today. We'll not restart.  Hyperlipidemia Fasting lipid panel scheduled for next Friday  Bilateral carotid bruits Carotid Dopplers were stable. Follow-up in 2 years.    Signed, Jacolyn Reedy, PA-C  09/06/2015 2:19 PM    [facility] [address] [place], [place], [place]  [address] Phone: [phone]; Fax: [phone]

## 2015-09-06 NOTE — Assessment & Plan Note (Signed)
Fasting lipid panel scheduled for next Friday

## 2015-09-06 NOTE — Assessment & Plan Note (Signed)
Doing well after STEMI and successful DES 2 to the mid RCA. No chest pain.

## 2015-09-06 NOTE — Assessment & Plan Note (Signed)
Carotid Dopplers were stable. Follow-up in 2 years.

## 2015-09-06 NOTE — Assessment & Plan Note (Signed)
Blood pressure is high today. Increase lisinopril to 40 mg daily. Reiterated the importance of a low sodium diet. Come back in 2 weeks for blood pressure check with myself or pharmacy.

## 2015-09-06 NOTE — Patient Instructions (Signed)
Medication Instructions:  Your physician has recommended you make the following change in your medication:  INCREASE Lisinopril to 40mg  daily   Labwork: Your physician recommends that you return for a FASTING lipid profile and lft on 09/16/15   Testing/Procedures: None ordered  Follow-Up: Your physician recommends that you schedule a follow-up appointment in: [facility] same day as lab  Follow as planned with Dr.Mcalhany  Any Other Special Instructions Will Be Listed Below (If Applicable).     If you need a refill on your cardiac medications before your next appointment, please call your pharmacy.

## 2015-09-06 NOTE — Assessment & Plan Note (Signed)
I asked the patient to decrease his metoprolol to 12.5 mg daily and keep a close sinus heart rate. There was a misunderstanding and he stopped his metoprolol altogether. His heart rate is 76 today. We'll not restart.

## 2015-09-12 ENCOUNTER — Other Ambulatory Visit: Payer: 59

## 2015-09-16 ENCOUNTER — Ambulatory Visit (INDEPENDENT_AMBULATORY_CARE_PROVIDER_SITE_OTHER): Payer: 59 | Admitting: Pharmacist

## 2015-09-16 ENCOUNTER — Encounter: Payer: Self-pay | Admitting: Pharmacist

## 2015-09-16 ENCOUNTER — Other Ambulatory Visit (INDEPENDENT_AMBULATORY_CARE_PROVIDER_SITE_OTHER): Payer: 59 | Admitting: *Deleted

## 2015-09-16 VITALS — BP 154/102 | HR 68

## 2015-09-16 DIAGNOSIS — I10 Essential (primary) hypertension: Secondary | ICD-10-CM | POA: Diagnosis not present

## 2015-09-16 DIAGNOSIS — E785 Hyperlipidemia, unspecified: Secondary | ICD-10-CM | POA: Diagnosis not present

## 2015-09-16 LAB — LIPID PANEL
Cholesterol: 102 mg/dL — ABNORMAL LOW (ref 125–200)
HDL: 42 mg/dL (ref >=40)
LDL CALC: 49 mg/dL (ref <130)
TRIGLYCERIDES: 53 mg/dL (ref <150)
Total CHOL/HDL Ratio: 2.4 Ratio (ref <=5.0)
VLDL: 11 mg/dL (ref <30)

## 2015-09-16 LAB — BASIC METABOLIC PANEL
BUN: 13 mg/dL (ref 7–25)
CHLORIDE: 105 mmol/L (ref 98–110)
CO2: 21 mmol/L (ref 20–31)
CREATININE: 0.83 mg/dL (ref 0.70–1.33)
Calcium: 9.2 mg/dL (ref 8.6–10.3)
Glucose, Bld: 101 mg/dL — ABNORMAL HIGH (ref 65–99)
Potassium: 4.1 mmol/L (ref 3.5–5.3)
Sodium: 139 mmol/L (ref 135–146)

## 2015-09-16 LAB — HEPATIC FUNCTION PANEL
ALBUMIN: 4.1 g/dL (ref 3.6–5.1)
ALK PHOS: 65 U/L (ref 40–115)
ALT: 50 U/L — ABNORMAL HIGH (ref 9–46)
AST: 37 U/L — ABNORMAL HIGH (ref 10–35)
Bilirubin, Direct: 0.2 mg/dL (ref <=0.2)
Indirect Bilirubin: 0.8 mg/dL (ref 0.2–1.2)
TOTAL PROTEIN: 7.2 g/dL (ref 6.1–8.1)
Total Bilirubin: 1 mg/dL (ref 0.2–1.2)

## 2015-09-16 MED ORDER — NITROGLYCERIN 0.4 MG SL SUBL: 0.4000 mg | SUBLINGUAL_TABLET | SUBLINGUAL | Status: DC | PRN

## 2015-09-16 MED ORDER — CARVEDILOL 3.125 MG PO TABS: 3.1250 mg | ORAL_TABLET | Freq: Two times a day (BID) | ORAL | Status: DC

## 2015-09-16 NOTE — Progress Notes (Signed)
Patient ID: [name]                 DOB: [birth year], 59 yo                        MRN: [id number]     HPI: Donald Wood is a 59 y.o. male referred by Jacolyn Reedy to HTN clinic. PMH is significant for HTN, CAD, and STEMI on 07/28/15 treated with PCI and DES x2 to the mid RCA. Pt was discharged on metoprolol, pt was supposed to decrease dose when he was bradycardic but pt stopped therapy instead and beta blocker therapy has not been restarted. Pt also has a history of uncontrolled HTN with systolic BP ranging 120s-190s. His lisinopril dose was doubled from  to  at last visit 3 weeks ago.   Pt reports that he has been tolerating his medications with no adverse effects. He does not monitor his BP at home because he does not have a cuff, but reports that he could have it checked by the nurse at work whenever he would like.  Pt had lipid panel and LFTs ordered before visit today - added on BMET d/t recent dose increase of ACEi.  Current HTN meds: lisinopril  daily Previously tried: Toprol  dailiy - was bradycardic to 40s. Was supposed to cut dose in half but pt stopped taking altogether. Pt seen 2 weeks ago and HR was up to 78, beta blocker was not restarted at that time. BP goal: <140/67mmHg  Family History: CAD in his mother, heart attack, HTN, and stroke in his father.  Social History: Pt reports that he has never smoked. He reports tha the drinks alcohol but does not use illicit drugs.  Diet: Has been trying to cut out salt from  Exercise: Pt works for the [employer] and stays active with frequent walking at work.  Wt Readings from Last 3 Encounters:  09/06/15 245 lb (111.131 kg)  08/15/15 240 lb (108.863 kg)  07/31/15 240 lb (108.863 kg)   BP Readings from Last 3 Encounters:  09/06/15 170/98  08/15/15 180/82  07/31/15 125/61   Pulse Readings from Last 3 Encounters:  09/06/15 78  08/15/15 43  07/31/15 49    Renal function: CrCl cannot be calculated (Patient  has no serum creatinine result on file.).  Past Medical History  Diagnosis Date  . Arthritis   . Hyperglycemia   . Obesity   . CAD (coronary artery disease), native coronary artery     07/29/15 Inferior infarct Cath showed normal left main, 30% diagonal, 60% distal LAD, 30% circumflex, occluded right coronary artery 4.0 x 38 mm and 4.0 x 16 mm Synergy drug-eluting stents to right coronary artery Dr. Sanjuana Kava   . Hyperlipidemia 07/31/2015    Current Outpatient Prescriptions on File Prior to Visit  Medication Sig Dispense Refill  . aspirin 81 MG chewable tablet Chew 1 tablet (81 mg total) by mouth daily.    Marland Kitchen atorvastatin (LIPITOR) 80 MG tablet Take 1 tablet (80 mg total) by mouth daily at 6 PM. 30 tablet 5  . lisinopril (PRINIVIL,ZESTRIL) 40 MG tablet Take 1 tablet (40 mg total) by mouth daily. 30 tablet 5  . ticagrelor (BRILINTA) 90 MG TABS tablet Take 1 tablet (90 mg total) by mouth 2 (two) times daily. 60 tablet 10   No current facility-administered medications on file prior to visit.    No Known Allergies   Assessment/Plan:  1. Hypertension -  Patient's BP still above goal <140/5790mmHg although improved since increasing lisinopril to 40mg . Pt had been on metoprolol in the past, had some bradycardia with 25mg  dose. Will start Coreg 3.125mg  BID - mortality benefit with beta blockers post-STEMI + non selective so will see more BP lowering than with metoprolol. Pt will monitor his BP and HR at work occasionally. Also sent Rx for prn NTG for pt since this was never prescribed post-STEMI, pt counseled on proper administration, benefit, and side effect profile. Rechecking BMET today with recent dose increase of lisinopril. Will f/u with patient in 3 weeks in HTN clinic.   Dian Laprade E. Dabria Wadas, PharmD [facility] [address], [place], [place] [address] Phone: [phone]; Fax: [phone] 09/16/2015 10:06 AM

## 2015-09-16 NOTE — Patient Instructions (Signed)
Start taking carvedilol 3.125mg  twice a day. Monitor your blood pressure and heart rate occasionally at work and give me a call in clinic [phone] if you find that your heart rate is consistently in the 50s. Pick up nitroglycerin sublingual tablets to take as needed for heart attack symptoms/chest pain. Follow up in blood pressure clinic in 3 weeks with Aundra MilletMegan on Friday, January 6 at [facility]9am.

## 2015-09-16 NOTE — Addendum Note (Signed)
Addended by: Ilana Prezioso K on: 09/16/2015 09:56 AM   Modules accepted: Orders  

## 2015-09-16 NOTE — Addendum Note (Signed)
Addended by: Tonita PhoenixBOWDEN, Vincente Asbridge K on: 09/16/2015 09:56 AM   Modules accepted: Orders

## 2015-09-16 NOTE — Addendum Note (Signed)
Addended by: BOWDEN, ROBIN K on: 09/16/2015 08:20 AM   Modules accepted: Orders  

## 2015-09-19 ENCOUNTER — Telehealth: Payer: Self-pay | Admitting: *Deleted

## 2015-09-19 DIAGNOSIS — I2510 Atherosclerotic heart disease of native coronary artery without angina pectoris: Secondary | ICD-10-CM

## 2015-09-19 NOTE — Telephone Encounter (Signed)
-----   Message from Dyann KiefMichele M Lenze, PA-C sent at 09/18/2015 10:40 PM EST ----- LFT's a little better. Recheck in Feb when he sees Dr. Clifton JamesMcAlhany

## 2015-09-19 NOTE — Telephone Encounter (Signed)
Called pt, per Herma CarsonMichelle Lenze, PA-C and let him know that his LFT's was improved and that we needed him to recheck them at his appt in 11/2015 with Dr. Clifton JamesMcAlhany.  Order has been put in EPIC.Marland Kitchen. Pt verbalized understanding.

## 2015-10-07 ENCOUNTER — Encounter: Payer: Self-pay | Admitting: Pharmacist

## 2015-10-07 ENCOUNTER — Ambulatory Visit (INDEPENDENT_AMBULATORY_CARE_PROVIDER_SITE_OTHER): Payer: 59 | Admitting: Pharmacist

## 2015-10-07 VITALS — BP 140/96 | HR 50

## 2015-10-07 DIAGNOSIS — I10 Essential (primary) hypertension: Secondary | ICD-10-CM

## 2015-10-07 DIAGNOSIS — R001 Bradycardia, unspecified: Secondary | ICD-10-CM | POA: Diagnosis not present

## 2015-10-07 MED ORDER — AMLODIPINE BESYLATE 5 MG PO TABS: 5.0000 mg | ORAL_TABLET | Freq: Every day | ORAL | Status: DC

## 2015-10-07 NOTE — Progress Notes (Signed)
Patient ID: Donald Wood DOB: Jan 14, 2056, 60 yo MRN: [id number]     HPI: Donald Wood is a 60 y.o. male who presents to HTN clinic for follow-up. PMH is significant for HTN, CAD, and STEMI on 07/28/15 treated with PCI and DES x2 to the mid RCA. Pt was discharged on metoprolol, pt was supposed to decrease dose when he was bradycardic but pt stopped therapy instead and beta blocker therapy had not been restarted. Saw pt in clinic 3 weeks ago and initiated low dose carvedilol at that time. Pt also has a history of uncontrolled HTN with systolic BP ranging 120s-190s. His lisinopril dose was doubled from 20mg  to 40mg  over a month ago, SCr and K have remained stable.  Pt reports that he has been tolerating his medications well. If he stands up quickly, he does become dizzy occasionally. He does not monitor his BP at home because he does not have a cuff. He is able to have his BP checked at work but has not done so since our last visit.  BP in clinic today improved at 140/96, however pulse down to 50 since starting carvedilol 3.125mg  BID. Was previously brady on metoprolol as well.  Current HTN meds: lisinopril 40mg  daily, carvedilol 3.125mg  BID Previously tried: Toprol 25mg  dailiy - was bradycardic to 40s. Was supposed to cut dose in half but pt stopped taking altogether. Pt seen 2 weeks ago and HR was up to 78, beta blocker was not restarted at that time. BP goal: <140/2890mmHg  Family History: CAD in his mother, heart attack, HTN, and stroke in his father.  Social History: Pt reports that he has never smoked. He reports that he drinks alcohol but does not use illicit drugs.  Diet: Has been trying to cut out salt from his diet.  Exercise: Pt works for the [employer] and stays active with frequent walking at work.   Wt Readings from Last 3 Encounters:  09/06/15 245 lb (111.131 kg)  08/15/15 240 lb (108.863 kg)  07/31/15 240 lb (108.863 kg)   BP Readings from  Last 3 Encounters:  10/07/15 140/96  09/16/15 154/102  09/06/15 170/98   Pulse Readings from Last 3 Encounters:  10/07/15 50  09/16/15 68  09/06/15 78    Renal function: CrCl cannot be calculated (Unknown ideal weight.).  Past Medical History  Diagnosis Date  . Arthritis   . Hyperglycemia   . Obesity   . CAD (coronary artery disease), native coronary artery     07/29/15 Inferior infarct Cath showed normal left main, 30% diagonal, 60% distal LAD, 30% circumflex, occluded right coronary artery 4.0 x 38 mm and 4.0 x 16 mm Synergy drug-eluting stents to right coronary artery Dr. Sanjuana KavaMcAlhaney   . Hyperlipidemia 07/31/2015    Current Outpatient Prescriptions on File Prior to Visit  Medication Sig Dispense Refill  . aspirin 81 MG chewable tablet Chew 1 tablet (81 mg total) by mouth daily.    Marland Kitchen. atorvastatin (LIPITOR) 80 MG tablet Take 1 tablet (80 mg total) by mouth daily at 6 PM. 30 tablet 5  . lisinopril (PRINIVIL,ZESTRIL) 40 MG tablet Take 1 tablet (40 mg total) by mouth daily. 30 tablet 5  . nitroGLYCERIN (NITROSTAT) 0.4 MG SL tablet Place 1 tablet (0.4 mg total) under the tongue every 5 (five) minutes as needed for chest pain. 30 tablet 5  . ticagrelor (BRILINTA) 90 MG TABS tablet Take 1 tablet (90 mg total) by mouth 2 (two) times daily. 60 tablet 10  No current facility-administered medications on file prior to visit.    No Known Allergies   Assessment/Plan:  1. Hypertension - Patient's BP improved and closer to goal <140/7mmHg since starting carvedilol, however pulse has dropped to 50. Pt previously bradycardic on metoprolol, rechallenged with carvedilol since there is a mortality benefit with beta blockers in ACS patients. Alpha effects of carvedilol have helped to improve BP but pt is occasionally symptomatic with orthostatic hypotension, likely due to low pulse. Will discontinue carvedilol and initiate amlodipine 5mg  daily. Pt counseled on side effects and instructed to take  amlodipine before bedtime to help enforce dipping pattern with blood pressure overnight. He will follow up with Dr. Clifton Amadu as scheduled next month.    Megan E. Supple, PharmD [facility] [address] [place], [place] [address] Phone: [phone]; Fax: [phone] 10/07/2015 9:55 AM

## 2015-10-07 NOTE — Patient Instructions (Addendum)
Stop taking your carvedilol Start taking amlodipine 5mg  at night Continue taking lisinopril 40mg  in the morning Try to check your blood pressure at work in a week or two Call Jhovani Griswold in blood pressure clinic if you have any questions [phone]

## 2015-10-12 ENCOUNTER — Ambulatory Visit: Payer: 59 | Admitting: Cardiology

## 2015-10-28 ENCOUNTER — Telehealth: Payer: Self-pay | Admitting: *Deleted

## 2015-10-28 NOTE — Telephone Encounter (Signed)
Donald Wood is returning your call

## 2015-10-28 NOTE — Telephone Encounter (Signed)
Spoke with pt and rescheduled appt for March 3,2017 at 9:30. He reports he is doing well.

## 2015-10-28 NOTE — Telephone Encounter (Signed)
Pt was scheduled to see Dr. Clifton Carlyn on 11/17/15.  Dr. Clifton Samil has had change in schedule that day and will not be in office. I placed call to pt to reschedule appt. Left message to call back

## 2015-11-04 ENCOUNTER — Ambulatory Visit (INDEPENDENT_AMBULATORY_CARE_PROVIDER_SITE_OTHER): Payer: 59

## 2015-11-04 ENCOUNTER — Ambulatory Visit (INDEPENDENT_AMBULATORY_CARE_PROVIDER_SITE_OTHER): Payer: 59 | Admitting: Family Medicine

## 2015-11-04 VITALS — BP 140/80 | HR 86 | Temp 98.3°F | Resp 20 | Ht 67.0 in | Wt 246.8 lb

## 2015-11-04 DIAGNOSIS — M79641 Pain in right hand: Secondary | ICD-10-CM

## 2015-11-04 DIAGNOSIS — Z23 Encounter for immunization: Secondary | ICD-10-CM | POA: Diagnosis not present

## 2015-11-04 DIAGNOSIS — S61401A Unspecified open wound of right hand, initial encounter: Secondary | ICD-10-CM | POA: Diagnosis not present

## 2015-11-04 NOTE — Progress Notes (Signed)
Risk and benefits discussed and verbal consent obtained. Anesthetic allergies reviewed. Patient anesthetized using 1:1 mix of 2% lidocaine with epi and Marcaine. The wound was cleansed thoroughly with soap and water. Sterile prep and drape. Wound closed with 4 throws using 5-0 Ethilon suture material. Hemostasis achieved. Mupirocin applied to the wound and bandage placed. The patient tolerated well. Wound instructions were provided and the patient is to return in 10 days for suture removal. Deliah Boston, MS, PA-C 6:42 PM, 11/04/2015

## 2015-11-04 NOTE — Patient Instructions (Addendum)
Because you received an x-ray today, you will receive an invoice from [facility]. Please contact [facility] at [phone] with questions or concerns regarding your invoice. Our billing staff will not be able to assist you with those questions.  WOUND CARE Please return in 10 days to have your stitches/staples removed or sooner if you have concerns. Marland Kitchen Keep area clean and dry for 24 hours. Do not remove bandage, if applied. . After 24 hours, remove bandage and wash wound gently with mild soap and warm water. Reapply a new bandage after cleaning wound, if directed. . Continue daily cleansing with soap and water until stitches/staples are removed. . Do not apply any ointments or creams to the wound while stitches/staples are in place, as this may cause delayed healing. . Notify the office if you experience any of the following signs of infection: Swelling, redness, pus drainage, streaking, fever >101.0 F . Notify the office if you experience excessive bleeding that does not stop after 15-20 minutes of constant, firm pressure.

## 2015-11-04 NOTE — Progress Notes (Signed)
Patient ID: Donald Wood, male    DOB: June 06, 1956  Age: 60 y.o. MRN: [id number]  Chief Complaint  Patient presents with  . Hand Injury    today    Subjective:   Patient was helping a neighbor with his vehicle and somehow got his hand caught down in machine. He twisted his right wrist. He has multiple abrasions on his hand and a laceration on the thenar eminence of the thumb. He works for the city, thinks he is had a tetanus shot but is not sure so wants [place] to go ahead.  Current allergies, medications, problem list, past/family and social histories reviewed.  Objective:  BP 140/80 mmHg  Pulse 86  Temp(Src) 98.3 F (36.8 C) (Oral)  Resp 20  Ht  (1.702 m)  Wt 246 lb 12.8 oz (111.948 kg)  BMI 38.65 kg/m2  SpO2 98%  No major acute distress. He has multiple little skin abrasions on the back of his hand, none of which appear deep enough to require repair. This was washed and the skin approximated. He has a deeper appearing irregularly shaped wound on his thenar eminence of the thumb. This worker sutures.  Assessment & Plan:   Assessment: 1. Pain of right hand   2. Wound, open, hand with or without fingers, right, initial encounter       Plan: Sutures will be applied by the PA. X-ray was done and appears normal.    Orders Placed This Encounter  Procedures  . DG Hand Complete Right    Standing Status: Future     Number of Occurrences: 1     Standing Expiration Date: 11/03/2016    Order Specific Question:  Reason for Exam (SYMPTOM  OR DIAGNOSIS REQUIRED)    Answer:  laceration of the right thenar emeinence.    Order Specific Question:  Preferred imaging location?    Answer:  External  . Tdap vaccine greater than or equal to 7yo IM    No orders of the defined types were placed in this encounter.         Patient Instructions  Because you received an x-ray today, you will receive an invoice from [facility]. Please contact [facility] at  [phone] with questions or concerns regarding your invoice. Our billing staff will not be able to assist you with those questions.  WOUND CARE Please return in 10 days to have your stitches/staples removed or sooner if you have concerns. Marland Kitchen Keep area clean and dry for 24 hours. Do not remove bandage, if applied. . After 24 hours, remove bandage and wash wound gently with mild soap and warm water. Reapply a new bandage after cleaning wound, if directed. . Continue daily cleansing with soap and water until stitches/staples are removed. . Do not apply any ointments or creams to the wound while stitches/staples are in place, as this may cause delayed healing. . Notify the office if you experience any of the following signs of infection: Swelling, redness, pus drainage, streaking, fever >101.0 F . Notify the office if you experience excessive bleeding that does not stop after 15-20 minutes of constant, firm pressure.        10 days RTC  HOPPER,DAVID, MD 11/04/2015

## 2015-11-17 ENCOUNTER — Ambulatory Visit: Payer: 59 | Admitting: Cardiovascular Disease

## 2015-11-17 ENCOUNTER — Ambulatory Visit (INDEPENDENT_AMBULATORY_CARE_PROVIDER_SITE_OTHER): Payer: 59 | Admitting: Family Medicine

## 2015-11-17 VITALS — BP 170/96 | HR 72 | Temp 98.0°F | Resp 18 | Ht 67.0 in | Wt 247.0 lb

## 2015-11-17 DIAGNOSIS — I10 Essential (primary) hypertension: Secondary | ICD-10-CM | POA: Diagnosis not present

## 2015-11-17 DIAGNOSIS — L409 Psoriasis, unspecified: Secondary | ICD-10-CM | POA: Diagnosis not present

## 2015-11-17 DIAGNOSIS — S61401D Unspecified open wound of right hand, subsequent encounter: Secondary | ICD-10-CM | POA: Diagnosis not present

## 2015-11-17 MED ORDER — TRIAMCINOLONE ACETONIDE 0.1 % EX CREA: 1.0000 "application " | TOPICAL_CREAM | Freq: Three times a day (TID) | CUTANEOUS | Status: DC

## 2015-11-17 NOTE — Patient Instructions (Addendum)
The salty food earlier today is likely reason for elevated blood pressure. Keep a record of your blood pressures outside of the office.  If remaining over 140/90 tomorrow - may need to change meds - return here or call cardiologist's office for instructions.   Polysporin and keep hand bandaged at work until wound heals. Return to the clinic or go to the nearest emergency room if any of your symptoms worsen or new symptoms occur.  Your left knee rash appears to be psoriasis. Try steroid cream up to 3 times per day for itching, Eucerin lotion up to 3 times per day.  If not improving in next few weeks - we can refer you to dermatology. Return to the clinic or go to the nearest emergency room if any of your symptoms worsen or new symptoms occur.   Psoriasis Psoriasis is a long-term (chronic) condition of skin inflammation. It occurs because your immune system causes skin cells to form too quickly. As a result, too many skin cells grow and create raised, red patches (plaques) that look silvery on your skin. Plaques may appear anywhere on your body. They can be any size or shape. Psoriasis can come and go. The condition varies from mild to very severe. It cannot be passed from one person to another (not contagious).  CAUSES  The cause of psoriasis is not known, but certain factors can make the condition worse. These include:   Damage or trauma to the skin, such as cuts, scrapes, sunburn, and dryness.  Lack of sunlight.  Certain medicines.  Alcohol.  Tobacco use.  Stress.  Infections caused by bacteria or viruses. RISK FACTORS This condition is more likely to develop in:  People with a family history of psoriasis.  People who are Caucasian.  People who are between the ages of 15-57 and 23-65 years old. SYMPTOMS  There are five different types of psoriasis. You can have more than one type of psoriasis during your life. Types are:    Plaque.  Guttate.  Inverse.  Pustular.  Erythrodermic. Each type of psoriasis has different symptoms.   Plaque psoriasis symptoms include red, raised plaques with a silvery white coating (scale). These plaques may be itchy. Your nails may be pitted and crumbly or fall off.  Guttate psoriasis symptoms include small red spots that often show up on your trunk, arms, and legs. These spots may develop after you have been sick, especially with strep throat.  Inverse psoriasis symptoms include plaques in your underarm area, under your breasts, or on your genitals, groin, or buttocks.  Pustular psoriasis symptoms include pus-filled bumps that are painful, red, and swollen on the palms of your hands or the soles of your feet. You also may feel exhausted, feverish, weak, or have no appetite.  Erythrodermic psoriasis symptoms include bright red skin that may look burned. You may have a fast heartbeat and a body temperature that is too high or too low. You may be itchy or in pain. DIAGNOSIS  Your health care provider may suspect psoriasis based on your symptoms and family history. Your health care provider will also do a physical exam. This may include a procedure to remove a tissue sample (biopsy) for testing. You may also be referred to a health care provider who specializes in skin diseases (dermatologist).  TREATMENT There is no cure for this condition, but treatment can help manage it. Goals of treatment include:   Helping your skin heal.  Reducing itching and inflammation.  Slowing the growth  of new skin cells.  Helping your immune system respond better to your skin. Treatment varies, depending on the severity of your condition. Treatment may include:   Creams or ointments.  Ultraviolet ray exposure (light therapy). This may include natural sunlight or light therapy in a medical office.  Medicines (systemic therapy). These medicines can help your body better manage skin cell  turnover and inflammation. They may be used along with light therapy or ointments. You may also get antibiotic medicines if you have an infection. HOME CARE INSTRUCTIONS Skin Care  Moisturize your skin as needed. Only use moisturizers that have been approved by your health care provider.   Apply cool compresses to the affected areas.   Do not scratch your skin.  Lifestyle  Do not use tobacco products. This includes cigarettes, chewing tobacco, and e-cigarettes. If you need help quitting, ask your health care provider.  Drink little or no alcohol.   Try techniques for stress reduction, such as meditation or yoga.  Get exposure to the sun as told by your health care provider. Do not get sunburned.   Consider joining a psoriasis support group.  Medicines  Take or use over-the-counter and prescription medicines only as told by your health care provider.  If you were prescribed an antibiotic, take or use it as told by your health care provider. Do not stop taking the antibiotic even if your condition starts to improve. General Instructions  Keep a journal to help track what triggers an outbreak. Try to avoid any triggers.   See a counselor or social worker if feelings of sadness, frustration, and hopelessness about your condition are interfering with your work and relationships.  Keep all follow-up visits as told by your health care provider. This is important. SEEK MEDICAL CARE IF:  Your pain gets worse.  You have increasing redness or warmth in the affected areas.   You have new or worsening pain or stiffness in your joints.  Your nails start to break easily or pull away from the nail bed.   You have a fever.   You feel depressed.   This information is not intended to replace advice given to you by your health care provider. Make sure you discuss any questions you have with your health care provider.   Document Released: 09/14/2000 Document Revised:  06/08/2015 Document Reviewed: 02/02/2015 Elsevier Interactive Patient Education [employer]

## 2015-11-17 NOTE — Progress Notes (Signed)
Subjective:    Patient ID: Donald Wood, male    DOB: 1956/01/23, 60 y.o.   MRN: [id number]  HPI Donald Wood is a 60 y.o. male  Here for suture removal. #4 sutures placed to hand wound on February 3. See last office visit and procedure note by Deliah Boston PA-C.Some initial discharge - applied neosporin. No pain past few days.    Hx of HTN, took meds this morning. Did have some boneless wings at lunch.  No chest pain or dyspnea. No missed doses medicines.  Also c/o knee rash. Thickened rash - small spot, now spreading -  Thickened skin and itching. Applying otc lotion. Few spots on the right leg, but are smaller than the left leg symptoms.    Patient Active Problem List   Diagnosis Date Noted  . Sinus bradycardia 08/15/2015  . Bilateral carotid bruits 08/15/2015  . CAD (coronary artery disease), native coronary artery   . Hyperlipidemia   . ST elevation myocardial infarction (STEMI) of inferior wall (HCC) 07/29/2015  . Essential hypertension 07/29/2015  . Hyperglycemia 07/29/2015  . Obesity   . Osteoarthritis of left knee 12/17/2014   Past Medical History  Diagnosis Date  . Arthritis   . Hyperglycemia   . Obesity   . CAD (coronary artery disease), native coronary artery     07/29/15 Inferior infarct Cath showed normal left main, 30% diagonal, 60% distal LAD, 30% circumflex, occluded right coronary artery 4.0 x 38 mm and 4.0 x 16 mm Synergy drug-eluting stents to right coronary artery Dr. Sanjuana Kava   . Hyperlipidemia 07/31/2015   Past Surgical History  Procedure Laterality Date  . Total knee arthroplasty Left 12/17/2014    Procedure: LEFT TOTAL KNEE ARTHROPLASTY;  Surgeon: Kathryne Hitch, MD;  Location: [facility];  Service: Orthopedics;  Laterality: Left;  . Cardiac catheterization N/A 07/29/2015    Procedure: Left Heart Cath and Coronary Angiography;  Surgeon: Kathleene Hazel, MD;  Location: [facility] INVASIVE CV LAB;  Service: Cardiovascular;  Laterality: N/A;  .  Cardiac catheterization N/A 07/29/2015    Procedure: Coronary Stent Intervention;  Surgeon: Kathleene Hazel, MD;  Location: [facility] INVASIVE CV LAB;  Service: Cardiovascular;  Laterality: N/A;   Allergies  Allergen Reactions  . Metoprolol Other (See Comments)    Pt trialed on metoprolol and carvedilol - bradycardic with HR 40-50 on both beta blockers.   Prior to Admission medications   Medication Sig Start Date End Date Taking? Authorizing Provider  amLODipine (NORVASC) 5 MG tablet Take 1 tablet (5 mg total) by mouth daily. 10/07/15  Yes Kathleene Hazel, MD  aspirin 81 MG chewable tablet Chew 1 tablet (81 mg total) by mouth daily. 07/31/15  Yes Brittainy Sherlynn Carbon, PA-C  atorvastatin (LIPITOR) 80 MG tablet Take 1 tablet (80 mg total) by mouth daily at 6 PM. 07/31/15  Yes Brittainy Sherlynn Carbon, PA-C  lisinopril (PRINIVIL,ZESTRIL) 40 MG tablet Take 1 tablet (40 mg total) by mouth daily. 09/06/15  Yes Dyann Kief, PA-C  nitroGLYCERIN (NITROSTAT) 0.4 MG SL tablet Place 1 tablet (0.4 mg total) under the tongue every 5 (five) minutes as needed for chest pain. 09/16/15  Yes Kathleene Hazel, MD  ticagrelor (BRILINTA) 90 MG TABS tablet Take 1 tablet (90 mg total) by mouth 2 (two) times daily. 07/31/15  Yes Brittainy Sherlynn Carbon, PA-C   Social History   Social History  . Marital Status: Married    Spouse Name: N/A  . Number of Children: 2  .  Years of Education: N/A   Occupational History  . Not on file.   Social History Main Topics  . Smoking status: Never Smoker   . Smokeless tobacco: Not on file  . Alcohol Use: 0.0 oz/week    0 Standard drinks or equivalent per week     Comment: occassionally - but has not had anything to drink in several months  . Drug Use: No  . Sexual Activity:    Partners: Female   Other Topics Concern  . Not on file   Social History Narrative     Review of Systems  Constitutional: Negative for fever, chills, fatigue and unexpected weight  change.  Eyes: Negative for visual disturbance.  Respiratory: Negative for cough, chest tightness and shortness of breath.   Cardiovascular: Negative for chest pain, palpitations and leg swelling.  Gastrointestinal: Negative for abdominal pain and blood in stool.  Musculoskeletal: Positive for arthralgias (min discomfort ).  Skin: Positive for rash (L knee).  Neurological: Negative for dizziness, light-headedness and headaches.         Objective:   Physical Exam  Constitutional: He is oriented to person, place, and time. He appears well-developed and well-nourished. No distress.  HENT:  Head: Normocephalic and atraumatic.  Eyes: EOM are normal. Pupils are equal, round, and reactive to light.  Neck: No JVD present. Carotid bruit is not present.  Cardiovascular: Normal rate, regular rhythm and normal heart sounds.   No murmur heard. Pulmonary/Chest: Effort normal and breath sounds normal. He has no rales.  Musculoskeletal: He exhibits no edema.       Hands: Neurological: He is alert and oriented to person, place, and time.  Skin: Skin is warm and dry. Rash noted.     Psychiatric: He has a normal mood and affect.  Vitals reviewed.         Assessment & Plan:   Donald Wood is a 60 y.o. male Essential hypertension  -Elevated in office. Suspect this may be related to diet and higher sodium food earlier in the day. However he should check his blood pressure outside the office, and for next few days, and if remaining over 140/90, return to clinic or call his cardiologist to discuss medication changes. RTC/ER precautions if new symptoms.  Wound, open, hand with or without fingers, right, subsequent encounter  -Overall healing well. Some delay in removal of sutures. Center of wound with slight open component. Sutures were removed without difficulty. Center wound should heal by secondary intention  -can cover in the meantime with Polysporin antibiotic, keep clean and covered with  bandage at work, can leave open to air at night.   -RTC if any increased redness, pain, discharge, or not improving within the next week to 10 days.  Psoriasis - Plan: triamcinolone cream (KENALOG) 0.1 %   -Suspected patch of psoriasis left anterior knee. Few small patches on the right leg. Can initially start with triamcinolone cream twice a day to 3 times a day, over-the-counter Eucerin lotion. If not improving the next few weeks, can refer to dermatology or return to discuss further. Handout given  Meds ordered this encounter  Medications  . triamcinolone cream (KENALOG) 0.1 %    Sig: Apply 1 application topically 3 (three) times daily.    Dispense:  30 g    Refill:  0   Patient Instructions  The salty food earlier today is likely reason for elevated blood pressure. Keep a record of your blood pressures outside of the office.  If  remaining over 140/90 tomorrow - may need to change meds - return here or call cardiologist's office for instructions.   Polysporin and keep hand bandaged at work until wound heals. Return to the clinic or go to the nearest emergency room if any of your symptoms worsen or new symptoms occur.  Your left knee rash appears to be psoriasis. Try steroid cream up to 3 times per day for itching, Eucerin lotion up to 3 times per day.  If not improving in next few weeks - we can refer you to dermatology. Return to the clinic or go to the nearest emergency room if any of your symptoms worsen or new symptoms occur.   Psoriasis Psoriasis is a long-term (chronic) condition of skin inflammation. It occurs because your immune system causes skin cells to form too quickly. As a result, too many skin cells grow and create raised, red patches (plaques) that look silvery on your skin. Plaques may appear anywhere on your body. They can be any size or shape. Psoriasis can come and go. The condition varies from mild to very severe. It cannot be passed from one person to another (not  contagious).  CAUSES  The cause of psoriasis is not known, but certain factors can make the condition worse. These include:   Damage or trauma to the skin, such as cuts, scrapes, sunburn, and dryness.  Lack of sunlight.  Certain medicines.  Alcohol.  Tobacco use.  Stress.  Infections caused by bacteria or viruses. RISK FACTORS This condition is more likely to develop in:  People with a family history of psoriasis.  People who are Caucasian.  People who are between the ages of 15-17 and 67-70 years old. SYMPTOMS  There are five different types of psoriasis. You can have more than one type of psoriasis during your life. Types are:   Plaque.  Guttate.  Inverse.  Pustular.  Erythrodermic. Each type of psoriasis has different symptoms.   Plaque psoriasis symptoms include red, raised plaques with a silvery white coating (scale). These plaques may be itchy. Your nails may be pitted and crumbly or fall off.  Guttate psoriasis symptoms include small red spots that often show up on your trunk, arms, and legs. These spots may develop after you have been sick, especially with strep throat.  Inverse psoriasis symptoms include plaques in your underarm area, under your breasts, or on your genitals, groin, or buttocks.  Pustular psoriasis symptoms include pus-filled bumps that are painful, red, and swollen on the palms of your hands or the soles of your feet. You also may feel exhausted, feverish, weak, or have no appetite.  Erythrodermic psoriasis symptoms include bright red skin that may look burned. You may have a fast heartbeat and a body temperature that is too high or too low. You may be itchy or in pain. DIAGNOSIS  Your health care provider may suspect psoriasis based on your symptoms and family history. Your health care provider will also do a physical exam. This may include a procedure to remove a tissue sample (biopsy) for testing. You may also be referred to a health care  provider who specializes in skin diseases (dermatologist).  TREATMENT There is no cure for this condition, but treatment can help manage it. Goals of treatment include:   Helping your skin heal.  Reducing itching and inflammation.  Slowing the growth of new skin cells.  Helping your immune system respond better to your skin. Treatment varies, depending on the severity of your condition. Treatment  may include:   Creams or ointments.  Ultraviolet ray exposure (light therapy). This may include natural sunlight or light therapy in a medical office.  Medicines (systemic therapy). These medicines can help your body better manage skin cell turnover and inflammation. They may be used along with light therapy or ointments. You may also get antibiotic medicines if you have an infection. HOME CARE INSTRUCTIONS Skin Care  Moisturize your skin as needed. Only use moisturizers that have been approved by your health care provider.   Apply cool compresses to the affected areas.   Do not scratch your skin.  Lifestyle  Do not use tobacco products. This includes cigarettes, chewing tobacco, and e-cigarettes. If you need help quitting, ask your health care provider.  Drink little or no alcohol.   Try techniques for stress reduction, such as meditation or yoga.  Get exposure to the sun as told by your health care provider. Do not get sunburned.   Consider joining a psoriasis support group.  Medicines  Take or use over-the-counter and prescription medicines only as told by your health care provider.  If you were prescribed an antibiotic, take or use it as told by your health care provider. Do not stop taking the antibiotic even if your condition starts to improve. General Instructions  Keep a journal to help track what triggers an outbreak. Try to avoid any triggers.   See a counselor or social worker if feelings of sadness, frustration, and hopelessness about your condition are  interfering with your work and relationships.  Keep all follow-up visits as told by your health care provider. This is important. SEEK MEDICAL CARE IF:  Your pain gets worse.  You have increasing redness or warmth in the affected areas.   You have new or worsening pain or stiffness in your joints.  Your nails start to break easily or pull away from the nail bed.   You have a fever.   You feel depressed.   This information is not intended to replace advice given to you by your health care provider. Make sure you discuss any questions you have with your health care provider.   Document Released: 09/14/2000 Document Revised: 06/08/2015 Document Reviewed: 02/02/2015 Elsevier Interactive Patient Education [employer]

## 2015-12-02 ENCOUNTER — Ambulatory Visit (INDEPENDENT_AMBULATORY_CARE_PROVIDER_SITE_OTHER): Payer: 59 | Admitting: Cardiovascular Disease

## 2015-12-02 ENCOUNTER — Encounter: Payer: Self-pay | Admitting: Cardiovascular Disease

## 2015-12-02 VITALS — BP 152/98 | HR 65 | Ht 67.0 in | Wt 239.0 lb

## 2015-12-02 DIAGNOSIS — I10 Essential (primary) hypertension: Secondary | ICD-10-CM | POA: Diagnosis not present

## 2015-12-02 DIAGNOSIS — E785 Hyperlipidemia, unspecified: Secondary | ICD-10-CM | POA: Diagnosis not present

## 2015-12-02 DIAGNOSIS — I2510 Atherosclerotic heart disease of native coronary artery without angina pectoris: Secondary | ICD-10-CM | POA: Diagnosis not present

## 2015-12-02 MED ORDER — AMLODIPINE BESYLATE 10 MG PO TABS: 10.0000 mg | ORAL_TABLET | Freq: Every day | ORAL | Status: DC

## 2015-12-02 NOTE — Progress Notes (Signed)
Chief Complaint  Patient presents with  . Follow-up     History of Present Illness: 60 yo male with history of CAD, HLD and HTN here today for cardiac follow up. [facility] with an inferior STEMI. His RCA was occluded and was treated with DES x 2 in the mid RCA. Echo with LVEF=55-60%. Moderate disease in the Ramus and LAD. Metoprolol stopped due to bradycardia. He has seen Herma CarsonMichelle Lenze, PA-C several times over the last 4 months in our office and BP meds have been adjusted.   He is here today for follow up. He has no chest pain or SOB. No LE edema. He feels great.    Primary Care Physician: No PCP Per Patient   Past Medical History  Diagnosis Date  . Arthritis   . Hyperglycemia   . Obesity   . CAD (coronary artery disease), native coronary artery     07/29/15 Inferior infarct Cath showed normal left main, 30% diagonal, 60% distal LAD, 30% circumflex, occluded right coronary artery 4.0 x 38 mm and 4.0 x 16 mm Synergy drug-eluting stents to right coronary artery Dr. Sanjuana KavaMcAlhaney   . Hyperlipidemia 07/31/2015  . Hypertension     Past Surgical History  Procedure Laterality Date  . Total knee arthroplasty Left 12/17/2014    Procedure: LEFT TOTAL KNEE ARTHROPLASTY;  Surgeon: Kathryne Hitchhristopher Y Blackman, MD;  Location: [facility];  Service: Orthopedics;  Laterality: Left;  . Cardiac catheterization N/A 07/29/2015    Procedure: Left Heart Cath and Coronary Angiography;  Surgeon: Kathleene Hazelhristopher D Mekenna Finau, MD;  [facility];  Service: Cardiovascular;  Laterality: N/A;  . Cardiac catheterization N/A 07/29/2015    Procedure: Coronary Stent Intervention;  Surgeon: Kathleene Hazelhristopher D Onis Markoff, MD;  [facility];  Service: Cardiovascular;  Laterality: N/A;    Current Outpatient Prescriptions  Medication Sig Dispense Refill  . amLODipine (NORVASC) 10 MG tablet Take 1 tablet (10 mg total) by mouth daily. 30 tablet 11  . aspirin 81 MG chewable tablet Chew 1  tablet (81 mg total) by mouth daily.    Marland Kitchen. atorvastatin (LIPITOR) 80 MG tablet Take 1 tablet (80 mg total) by mouth daily at 6 PM. 30 tablet 5  . lisinopril (PRINIVIL,ZESTRIL) 40 MG tablet Take 1 tablet (40 mg total) by mouth daily. 30 tablet 5  . nitroGLYCERIN (NITROSTAT) 0.4 MG SL tablet Place 1 tablet (0.4 mg total) under the tongue every 5 (five) minutes as needed for chest pain. 30 tablet 5  . ticagrelor (BRILINTA) 90 MG TABS tablet Take 1 tablet (90 mg total) by mouth 2 (two) times daily. 60 tablet 10  . triamcinolone cream (KENALOG) 0.1 % Apply 1 application topically 3 (three) times daily. 30 g 0   No current facility-administered medications for this visit.    Allergies  Allergen Reactions  . Metoprolol Other (See Comments)    Pt trialed on metoprolol and carvedilol - bradycardic with HR 40-50 on both beta blockers.    Social History   Social History  . Marital Status: Married    Spouse Name: N/A  . Number of Children: 2  . Years of Education: N/A   Occupational History  . Not on file.   Social History Main Topics  . Smoking status: Never Smoker   . Smokeless tobacco: Not on file  . Alcohol Use: 0.0 oz/week    0 Standard drinks or equivalent per week     Comment: occassionally - but has  not had anything to drink in several months  . Drug Use: No  . Sexual Activity:    Partners: Female   Other Topics Concern  . Not on file   Social History Narrative    Family History  Problem Relation Age of Onset  . CAD Mother     Mom had heart problems in her 21s  . Heart attack Father   . Stroke Father   . Hypertension Father     Review of Systems:  As stated in the HPI and otherwise negative.   BP 152/98 mmHg  Pulse 65  Ht  (1.702 m)  Wt 239 lb (108.41 kg)  BMI 37.42 kg/m2  Physical Examination: General: Well developed, well nourished, NAD HEENT: OP clear, mucus membranes moist SKIN: warm, dry. No rashes. Neuro: No focal deficits Musculoskeletal: Muscle  strength 5/5 all ext Psychiatric: Mood and affect normal Neck: No JVD, no carotid bruits, no thyromegaly, no lymphadenopathy. Lungs:Clear bilaterally, no wheezes, rhonci, crackles Cardiovascular: Regular rate and rhythm. No murmurs, gallops or rubs. Abdomen:Soft. Bowel sounds present. Non-tender.  Extremities: No lower extremity edema. Pulses are 2 + in the bilateral DP/PT.  EKG:  EKG is not ordered today. The ekg ordered today demonstrates   Recent Labs: 07/29/2015: Magnesium 1.8; TSH 1.070 07/30/2015: Hemoglobin 14.5; Platelets 229 09/16/2015: ALT 50*; BUN 13; Creat 0.83; Potassium 4.1; Sodium 139   Lipid Panel    Component Value Date/Time   CHOL 102* 09/16/2015 0858   TRIG 53 09/16/2015 0858   HDL 42 09/16/2015 0858   CHOLHDL 2.4 09/16/2015 0858   VLDL 11 09/16/2015 0858   LDLCALC 49 09/16/2015 0858     Wt Readings from Last 3 Encounters:  12/02/15 239 lb (108.41 kg)  11/17/15 247 lb (112.038 kg)  11/04/15 246 lb 12.8 oz (111.948 kg)     Other studies Reviewed: Additional studies/ records that were reviewed today include: . Review of the above records demonstrates:    Assessment and Plan:   1. CAD: He is s/p inferior STEMI in October 2016. DES x 2 mid RCA. Moderate disease in the LAD and Ramus. He has had no chest pain to suggest angina. Will continue Brilinta, statin, Ace-inh. He will stop ASA due to easy bruising.   2. HTN: BP elevated today.Will increase Norvasc to 10 mg daily. Continue Lisinopril. He has been followed in the HTN clinic.   3. HLD: LDL at goal. Will continue statin.   Current medicines are reviewed at length with the patient today.  The patient does not have concerns regarding medicines.  The following changes have been made:  no change  Labs/ tests ordered today include:  No orders of the defined types were placed in this encounter.     Disposition:   FU with me in 6 months   Signed, [name], MD 12/02/2015 9:59 AM    [facility] [address] [place], [place], [place]  [address] Phone: [phone]; Fax: [phone]

## 2015-12-02 NOTE — Patient Instructions (Signed)
Medication Instructions:  Your physician has recommended you make the following change in your medication: Increase Norvasc to 10 mg by mouth daily.    Labwork: none  Testing/Procedures: none  Follow-Up: Your physician wants you to follow-up in: 6 months  You will receive a reminder letter in the mail two months in advance. If you don't receive a letter, please call our office to schedule the follow-up appointment.   Any Other Special Instructions Will Be Listed Below (If Applicable).     If you need a refill on your cardiac medications before your next appointment, please call your pharmacy.   

## 2016-01-19 ENCOUNTER — Other Ambulatory Visit: Payer: Self-pay | Admitting: Cardiology

## 2016-03-02 ENCOUNTER — Other Ambulatory Visit: Payer: Self-pay | Admitting: Physician Assistant

## 2016-07-27 ENCOUNTER — Ambulatory Visit (INDEPENDENT_AMBULATORY_CARE_PROVIDER_SITE_OTHER): Payer: 59 | Admitting: Cardiovascular Disease

## 2016-07-27 ENCOUNTER — Encounter (INDEPENDENT_AMBULATORY_CARE_PROVIDER_SITE_OTHER): Payer: Self-pay

## 2016-07-27 ENCOUNTER — Encounter: Payer: Self-pay | Admitting: Cardiovascular Disease

## 2016-07-27 VITALS — BP 152/86 | HR 53 | Ht 68.0 in | Wt 240.0 lb

## 2016-07-27 DIAGNOSIS — I10 Essential (primary) hypertension: Secondary | ICD-10-CM

## 2016-07-27 DIAGNOSIS — E7800 Pure hypercholesterolemia, unspecified: Secondary | ICD-10-CM | POA: Diagnosis not present

## 2016-07-27 DIAGNOSIS — I2510 Atherosclerotic heart disease of native coronary artery without angina pectoris: Secondary | ICD-10-CM

## 2016-07-27 DIAGNOSIS — L409 Psoriasis, unspecified: Secondary | ICD-10-CM | POA: Diagnosis not present

## 2016-07-27 MED ORDER — CLOPIDOGREL BISULFATE 75 MG PO TABS: 75.0000 mg | ORAL_TABLET | Freq: Every day | ORAL | 3 refills | Status: DC

## 2016-07-27 MED ORDER — LISINOPRIL 40 MG PO TABS: 40.0000 mg | ORAL_TABLET | Freq: Every day | ORAL | 3 refills | Status: DC

## 2016-07-27 MED ORDER — HYDROCHLOROTHIAZIDE 25 MG PO TABS: 25.0000 mg | ORAL_TABLET | Freq: Every day | ORAL | 3 refills | Status: DC

## 2016-07-27 MED ORDER — TRIAMCINOLONE ACETONIDE 0.1 % EX CREA: 1.0000 "application " | TOPICAL_CREAM | Freq: Three times a day (TID) | CUTANEOUS | 0 refills | Status: DC

## 2016-07-27 MED ORDER — AMLODIPINE BESYLATE 10 MG PO TABS: 10.0000 mg | ORAL_TABLET | Freq: Every day | ORAL | 3 refills | Status: DC

## 2016-07-27 MED ORDER — ATORVASTATIN CALCIUM 80 MG PO TABS: ORAL_TABLET | ORAL | 3 refills | Status: DC

## 2016-07-27 NOTE — Progress Notes (Signed)
Chief Complaint  Patient presents with  . Follow-up    pt  c/o of feeling dizzy when trying to get up so fast      History of Present Illness: 60 yo male with history of CAD, HLD and HTN here today for cardiac follow up. He was admitted to [facility] 07/28/15 with an inferior STEMI. His RCA was occluded and was treated with DES x 2 in the mid RCA. Echo with LVEF=55-60%. Moderate disease in the Ramus and LAD. Metoprolol stopped due to bradycardia.   He is here today for follow up. He has no chest pain or SOB. No LE edema. He feels great.   Primary Care Physician: No PCP Per Patient   Past Medical History:  Diagnosis Date  . Arthritis   . CAD (coronary artery disease), native coronary artery    07/29/15 Inferior infarct Cath showed normal left main, 30% diagonal, 60% distal LAD, 30% circumflex, occluded right coronary artery 4.0 x 38 mm and 4.0 x 16 mm Synergy drug-eluting stents to right coronary artery Dr. Julianne Handler   . Hyperglycemia   . Hyperlipidemia 07/31/2015  . Hypertension   . Obesity     Past Surgical History:  Procedure Laterality Date  . CARDIAC CATHETERIZATION N/A 07/29/2015   Procedure: Left Heart Cath and Coronary Angiography;  Surgeon: Burnell Blanks, MD;  Location: [place] [facility];  Service: Cardiovascular;  Laterality: N/A;  . CARDIAC CATHETERIZATION N/A 07/29/2015   Procedure: Coronary Stent Intervention;  Surgeon: Burnell Blanks, MD;  Location: [place] [facility];  Service: Cardiovascular;  Laterality: N/A;  . TOTAL KNEE ARTHROPLASTY Left 12/17/2014   Procedure: LEFT TOTAL KNEE ARTHROPLASTY;  Surgeon: Mcarthur Rossetti, MD;  Location: [facility];  Service: Orthopedics;  Laterality: Left;    Current Outpatient Prescriptions  Medication Sig Dispense Refill  . amLODipine (NORVASC) 10 MG tablet Take 1 tablet (10 mg total) by mouth daily. 90 tablet 3  . atorvastatin (LIPITOR) 80 MG tablet TAKE 1 TABLET(80 MG) BY MOUTH DAILY AT 6 PM 90 tablet 3  .  lisinopril (PRINIVIL,ZESTRIL) 40 MG tablet Take 1 tablet (40 mg total) by mouth daily. 90 tablet 3  . nitroGLYCERIN (NITROSTAT) 0.4 MG SL tablet Place 1 tablet (0.4 mg total) under the tongue every 5 (five) minutes as needed for chest pain. 30 tablet 5  . triamcinolone cream (KENALOG) 0.1 % Apply 1 application topically 3 (three) times daily. 30 g 0  . clopidogrel (PLAVIX) 75 MG tablet Take 1 tablet (75 mg total) by mouth daily. 90 tablet 3  . hydrochlorothiazide (HYDRODIURIL) 25 MG tablet Take 1 tablet (25 mg total) by mouth daily. 90 tablet 3   No current facility-administered medications for this visit.     Allergies  Allergen Reactions  . Metoprolol Other (See Comments)    Pt trialed on metoprolol and carvedilol - bradycardic with HR 40-50 on both beta blockers.    Social History   Social History  . Marital status: Married    Spouse name: N/A  . Number of children: 2  . Years of education: N/A   Occupational History  . Not on file.   Social History Main Topics  . Smoking status: Never Smoker  . Smokeless tobacco: Not on file  . Alcohol use 0.0 oz/week     Comment: occassionally - but has not had anything to drink in several months  . Drug use: No  . Sexual activity: Yes    Partners: Female  Other Topics Concern  . Not on file   Social History Narrative  . No narrative on file    Family History  Problem Relation Age of Onset  . CAD Mother     Mom had heart problems in her 54s  . Heart attack Father   . Stroke Father   . Hypertension Father     Review of Systems:  As stated in the HPI and otherwise negative.   BP (!) 152/86   Pulse (!) 53   Ht 5' 8"  (1.727 m)   Wt 240 lb (108.9 kg)   BMI 36.49 kg/m   Physical Examination: General: Well developed, well nourished, NAD  HEENT: OP clear, mucus membranes moist  SKIN: warm, dry. No rashes. Neuro: No focal deficits  Musculoskeletal: Muscle strength 5/5 all ext  Psychiatric: Mood and affect normal  Neck:  No JVD, no carotid bruits, no thyromegaly, no lymphadenopathy.  Lungs:Clear bilaterally, no wheezes, rhonci, crackles Cardiovascular: Regular rate and rhythm. No murmurs, gallops or rubs. Abdomen:Soft. Bowel sounds present. Non-tender.  Extremities: No lower extremity edema. Pulses are 2 + in the bilateral DP/PT.  EKG:  EKG is ordered today. The ekg ordered today demonstrates Sinus brady, rate 53 bpm. Inferior Q-waves.   Recent Labs: 07/29/2015: Magnesium 1.8; TSH 1.070 07/30/2015: Hemoglobin 14.5; Platelets 229 09/16/2015: ALT 50; BUN 13; Creat 0.83; Potassium 4.1; Sodium 139   Lipid Panel    Component Value Date/Time   CHOL 102 (L) 09/16/2015 0858   TRIG 53 09/16/2015 0858   HDL 42 09/16/2015 0858   CHOLHDL 2.4 09/16/2015 0858   VLDL 11 09/16/2015 0858   LDLCALC 49 09/16/2015 0858     Wt Readings from Last 3 Encounters:  07/27/16 240 lb (108.9 kg)  12/02/15 239 lb (108.4 kg)  11/17/15 247 lb (112 kg)     Other studies Reviewed: Additional studies/ records that were reviewed today include: . Review of the above records demonstrates:    Assessment and Plan:   1. CAD: He is s/p inferior STEMI in October 2016. DES x 2 mid RCA. Moderate disease in the LAD and Ramus. He has had no chest pain to suggest angina. Will change Brilinta to Plavix 75 mg daily. Continue statin, Ace-inh. He has not tolerated beta blockers due to bradycardia. ASA stopped due to easy bruising.   2. HTN: BP is elevated. Will add HCTZ 25 mg daily. Check BMET along with other lab testing in several weeks.   3. HLD: LDL at goal. Will continue statin. Check lipids and LFTs in December 2017.  Current medicines are reviewed at length with the patient today.  The patient does not have concerns regarding medicines.  The following changes have been made:  no change  Labs/ tests ordered today include:   Orders Placed This Encounter  Procedures  . Comp Met (CMET)  . Lipid Profile  . EKG 12-Lead      Disposition:   FU with me in 6 months   Signed, Lauree Chandler, MD 07/27/2016 9:52 AM    [facility] [place], [place], [place]  [address] Phone: [phone]; Fax: [phone]

## 2016-07-27 NOTE — Patient Instructions (Addendum)
Medication Instructions:  Your physician has recommended you make the following change in your medication:    Stop Brilinta. Start Clopidogrel 75 mg by mouth daily. Start hydrochlorothiazide 25 mg by mouth daily.    Labwork: Your physician recommends that you return for lab work in: December.  This will be fasting. The lab opens at 7:30 AM--Scheduled for December 8,2017   Testing/Procedures: none  Follow-Up: Your physician wants you to follow-up in: 12 months.  You will receive a reminder letter in the mail two months in advance. If you don't receive a letter, please call our office to schedule the follow-up appointment.   Any Other Special Instructions Will Be Listed Below (If Applicable).     If you need a refill on your cardiac medications before your next appointment, please call your pharmacy.

## 2016-08-02 ENCOUNTER — Other Ambulatory Visit: Payer: Self-pay | Admitting: Cardiology

## 2016-09-07 ENCOUNTER — Other Ambulatory Visit: Payer: 59

## 2016-12-24 DIAGNOSIS — M5416 Radiculopathy, lumbar region: Secondary | ICD-10-CM | POA: Diagnosis not present

## 2017-01-09 DIAGNOSIS — M5441 Lumbago with sciatica, right side: Secondary | ICD-10-CM | POA: Diagnosis not present

## 2017-01-09 DIAGNOSIS — G8929 Other chronic pain: Secondary | ICD-10-CM | POA: Diagnosis not present

## 2017-02-05 DIAGNOSIS — R5383 Other fatigue: Secondary | ICD-10-CM | POA: Diagnosis not present

## 2017-02-05 DIAGNOSIS — E538 Deficiency of other specified B group vitamins: Secondary | ICD-10-CM | POA: Diagnosis not present

## 2017-02-05 DIAGNOSIS — R61 Generalized hyperhidrosis: Secondary | ICD-10-CM | POA: Diagnosis not present

## 2017-02-05 DIAGNOSIS — W19XXXA Unspecified fall, initial encounter: Secondary | ICD-10-CM | POA: Diagnosis not present

## 2017-03-21 DIAGNOSIS — J029 Acute pharyngitis, unspecified: Secondary | ICD-10-CM | POA: Diagnosis not present

## 2017-03-21 DIAGNOSIS — R05 Cough: Secondary | ICD-10-CM | POA: Diagnosis not present

## 2017-05-19 DIAGNOSIS — Z23 Encounter for immunization: Secondary | ICD-10-CM | POA: Diagnosis not present

## 2017-08-18 ENCOUNTER — Other Ambulatory Visit: Payer: Self-pay | Admitting: Cardiovascular Disease

## 2017-08-30 ENCOUNTER — Telehealth (HOSPITAL_COMMUNITY): Payer: Self-pay | Admitting: Cardiovascular Disease

## 2017-09-11 ENCOUNTER — Other Ambulatory Visit: Payer: Self-pay | Admitting: Cardiovascular Disease

## 2017-09-11 DIAGNOSIS — I6523 Occlusion and stenosis of bilateral carotid arteries: Secondary | ICD-10-CM

## 2017-09-17 ENCOUNTER — Other Ambulatory Visit: Payer: Self-pay | Admitting: Cardiovascular Disease

## 2017-09-27 ENCOUNTER — Ambulatory Visit (HOSPITAL_COMMUNITY)
Admission: RE | Admit: 2017-09-27 | Discharge: 2017-09-27 | Disposition: A | Discharge status: Home / Self Care | Payer: 59 | Source: Ambulatory Visit | Attending: Cardiovascular Disease | Admitting: Cardiovascular Disease

## 2017-09-27 DIAGNOSIS — I2510 Atherosclerotic heart disease of native coronary artery without angina pectoris: Secondary | ICD-10-CM | POA: Diagnosis not present

## 2017-09-27 DIAGNOSIS — I6523 Occlusion and stenosis of bilateral carotid arteries: Secondary | ICD-10-CM | POA: Diagnosis not present

## 2017-09-27 DIAGNOSIS — E785 Hyperlipidemia, unspecified: Secondary | ICD-10-CM | POA: Diagnosis not present

## 2017-09-27 DIAGNOSIS — I10 Essential (primary) hypertension: Secondary | ICD-10-CM | POA: Diagnosis not present

## 2017-09-27 DIAGNOSIS — Z87891 Personal history of nicotine dependence: Secondary | ICD-10-CM | POA: Insufficient documentation

## 2017-10-02 ENCOUNTER — Other Ambulatory Visit: Payer: Self-pay | Admitting: Cardiovascular Disease

## 2017-10-03 ENCOUNTER — Telehealth: Payer: Self-pay | Admitting: *Deleted

## 2017-10-03 MED ORDER — HYDROCHLOROTHIAZIDE 25 MG PO TABS: 25.0000 mg | ORAL_TABLET | Freq: Every day | ORAL | 1 refills | Status: DC

## 2017-10-03 MED ORDER — LISINOPRIL 40 MG PO TABS: 40.0000 mg | ORAL_TABLET | Freq: Every day | ORAL | 1 refills | Status: DC

## 2017-10-03 MED ORDER — ATORVASTATIN CALCIUM 80 MG PO TABS: ORAL_TABLET | ORAL | 1 refills | Status: DC

## 2017-10-03 MED ORDER — NITROGLYCERIN 0.4 MG SL SUBL: 0.4000 mg | SUBLINGUAL_TABLET | SUBLINGUAL | 6 refills | Status: DC | PRN

## 2017-10-03 MED ORDER — CLOPIDOGREL BISULFATE 75 MG PO TABS: 75.0000 mg | ORAL_TABLET | Freq: Every day | ORAL | 1 refills | Status: DC

## 2017-10-03 MED ORDER — AMLODIPINE BESYLATE 10 MG PO TABS: 10.0000 mg | ORAL_TABLET | Freq: Every day | ORAL | 1 refills | Status: DC

## 2017-10-03 NOTE — Telephone Encounter (Signed)
I spoke with pt and reviewed carotid doppler results with him. Pt reports he needs refill of medications. He has scheduled appointment with Dr. Clifton JamesMcAlhany for 01/06/18.  I offered a sooner appointment with PA or NP but pt would like to see Dr. Clifton JamesMcAlhany. Will add to wait list.  Will send refills to pharmacy. Pt aware he will need to keep appt on 01/06/18 for additional refills.

## 2018-01-06 ENCOUNTER — Ambulatory Visit: Payer: 59 | Admitting: Cardiovascular Disease

## 2018-01-15 NOTE — Progress Notes (Signed)
Chief Complaint  Patient presents with  . Coronary Artery Disease     History of Present Illness: 62 yo male with history of CAD, HLD, carotid artery disease and HTN here today for cardiac follow up. He was admitted to [facility] in October 2016 with an inferior STEMI. His mid RCA was occluded. 2 drug eluting stents were placed in the mid RCA. There was moderate non-obstructive disease in the LAD and Ramus intermediate branch. Echo October 2016 with LVEF=55-60%.  Metoprolol stopped due to bradycardia. Mild carotid disease by dopplers December 2018.   He is here today for follow up. The patient denies any chest pain, dyspnea, palpitations, lower extremity edema, orthopnea, PND, dizziness, near syncope or syncope.   Primary Care Physician: Patient, No Pcp Per  Past Medical History:  Diagnosis Date  . Arthritis   . CAD (coronary artery disease), native coronary artery    07/29/15 Inferior infarct Cath showed normal left main, 30% diagonal, 60% distal LAD, 30% circumflex, occluded right coronary artery 4.0 x 38 mm and 4.0 x 16 mm Synergy drug-eluting stents to right coronary artery Dr. Julianne Handler   . Hyperglycemia   . Hyperlipidemia 07/31/2015  . Hypertension   . Obesity     Past Surgical History:  Procedure Laterality Date  . CARDIAC CATHETERIZATION N/A 07/29/2015   Procedure: Left Heart Cath and Coronary Angiography;  Surgeon: Burnell Blanks, MD;  Location: [place] [facility];  Service: Cardiovascular;  Laterality: N/A;  . CARDIAC CATHETERIZATION N/A 07/29/2015   Procedure: Coronary Stent Intervention;  Surgeon: Burnell Blanks, MD;  Location: [place] [facility] [facility];  Service: Cardiovascular;  Laterality: N/A;  . TOTAL KNEE ARTHROPLASTY Left 12/17/2014   Procedure: LEFT TOTAL KNEE ARTHROPLASTY;  Surgeon: Mcarthur Rossetti, MD;  Location: [facility];  Service: Orthopedics;  Laterality: Left;    Current Outpatient Medications  Medication Sig Dispense Refill  . amLODipine  (NORVASC) 10 MG tablet Take 1 tablet (10 mg total) by mouth daily. 90 tablet 3  . atorvastatin (LIPITOR) 80 MG tablet TAKE 1 TABLET BY MOUTH DAILY AT 6 PM. 3 90 tablet 3  . clopidogrel (PLAVIX) 75 MG tablet Take 1 tablet (75 mg total) by mouth daily. 90 tablet 3  . hydrochlorothiazide (HYDRODIURIL) 25 MG tablet Take 1 tablet (25 mg total) by mouth daily. 90 tablet 3  . lisinopril (PRINIVIL,ZESTRIL) 40 MG tablet Take 1 tablet (40 mg total) by mouth daily. 90 tablet 3  . nitroGLYCERIN (NITROSTAT) 0.4 MG SL tablet Place 1 tablet (0.4 mg total) under the tongue every 5 (five) minutes as needed for chest pain. 25 tablet 6  . triamcinolone cream (KENALOG) 0.1 % Apply 1 application topically 3 (three) times daily. 30 g 0   No current facility-administered medications for this visit.     Allergies  Allergen Reactions  . Metoprolol Other (See Comments)    Pt trialed on metoprolol and carvedilol - bradycardic with HR 40-50 on both beta blockers.    Social History   Socioeconomic History  . Marital status: Married    Spouse name: Not on file  . Number of children: 2  . Years of education: Not on file  . Highest education level: Not on file  Occupational History  . Not on file  Social Needs  . Financial resource strain: Not on file  . Food insecurity:    Worry: Not on file    Inability: Not on file  . Transportation needs:    Medical: Not on  file    Non-medical: Not on file  Tobacco Use  . Smoking status: Never Smoker  Substance and Sexual Activity  . Alcohol use: Yes    Alcohol/week: 0.0 oz    Comment: occassionally - but has not had anything to drink in several months  . Drug use: No  . Sexual activity: Yes    Partners: Female  Lifestyle  . Physical activity:    Days per week: Not on file    Minutes per session: Not on file  . Stress: Not on file  Relationships  . Social connections:    Talks on phone: Not on file    Gets together: Not on file    Attends religious service:  Not on file    Active member of club or organization: Not on file    Attends meetings of clubs or organizations: Not on file    Relationship status: Not on file  . Intimate partner violence:    Fear of current or ex partner: Not on file    Emotionally abused: Not on file    Physically abused: Not on file    Forced sexual activity: Not on file  Other Topics Concern  . Not on file  Social History Narrative  . Not on file    Family History  Problem Relation Age of Onset  . CAD Mother        Mom had heart problems in her 13s  . Heart attack Father   . Stroke Father   . Hypertension Father     Review of Systems:  As stated in the HPI and otherwise negative.   BP 122/78   Pulse (!) 55   Ht _0  (1.727 m)   Wt 252 lb (114.3 kg)   SpO2 95%   BMI 38.32 kg/m   Physical Examination:  General: Well developed, well nourished, NAD  HEENT: OP clear, mucus membranes moist  SKIN: warm, dry. No rashes. Neuro: No focal deficits  Musculoskeletal: Muscle strength 5/5 all ext  Psychiatric: Mood and affect normal  Neck: No JVD, no carotid bruits, no thyromegaly, no lymphadenopathy.  Lungs:Clear bilaterally, no wheezes, rhonci, crackles Cardiovascular: Regular rate and rhythm. No murmurs, gallops or rubs. Abdomen:Soft. Bowel sounds present. Non-tender.  Extremities: No lower extremity edema. Pulses are 2 + in the bilateral DP/PT.  EKG:  EKG is ordered today. The ekg ordered today demonstrates  Sinus brady, rate 55 bpm. Old inferior Q waves.   Recent Labs: No results found for requested labs within last 8760 hours.   Lipid Panel    Component Value Date/Time   CHOL 102 (L) 09/16/2015 0858   TRIG 53 09/16/2015 0858   HDL 42 09/16/2015 0858   CHOLHDL 2.4 09/16/2015 0858   VLDL 11 09/16/2015 0858   LDLCALC 49 09/16/2015 0858     Wt Readings from Last 3 Encounters:  01/16/18 252 lb (114.3 kg)  07/27/16 240 lb (108.9 kg)  12/02/15 239 lb (108.4 kg)     Other studies  Reviewed: Additional studies/ records that were reviewed today include: . Review of the above records demonstrates:    Assessment and Plan:   1. CAD without angina: He had an inferior STEMI in October 2016. DES x 2 mid RCA. Moderate disease in the LAD and Ramus. He is doing well with no chest pain since his last visit here. Will continue Plavix, statin and Ace-inh. Beta blocker stopped due to bradycardia. ASA stopped due to easy bruising.   2. HTN:  BP is well controlled.  He is on Norvasc, HCTZ and Norvasc. Will check BMET  3. HLD: LDL at goal over a year ago. Will continue statin. Repeat lipids and LFTs now.   4. Carotid artery disease: Mild bilateral carotid artery disease by dopplers December 2018. Repeat December 2020.   Current medicines are reviewed at length with the patient today.  The patient does not have concerns regarding medicines.  The following changes have been made:  no change  Labs/ tests ordered today include:   Orders Placed This Encounter  Procedures  . Comp Met (CMET)  . Lipid Profile     Disposition:   FU with me in 6 months   Signed, Lauree Chandler, MD 01/16/2018 9:42 AM    [facility] [place], [place], [place]  [address] Phone: [phone]; Fax: [phone]

## 2018-01-16 ENCOUNTER — Encounter: Payer: Self-pay | Admitting: Cardiovascular Disease

## 2018-01-16 ENCOUNTER — Ambulatory Visit: Payer: 59 | Admitting: Cardiovascular Disease

## 2018-01-16 VITALS — BP 122/78 | HR 55 | Ht 68.0 in | Wt 252.0 lb

## 2018-01-16 DIAGNOSIS — I2510 Atherosclerotic heart disease of native coronary artery without angina pectoris: Secondary | ICD-10-CM | POA: Diagnosis not present

## 2018-01-16 DIAGNOSIS — I10 Essential (primary) hypertension: Secondary | ICD-10-CM

## 2018-01-16 DIAGNOSIS — E7800 Pure hypercholesterolemia, unspecified: Secondary | ICD-10-CM | POA: Diagnosis not present

## 2018-01-16 DIAGNOSIS — I6523 Occlusion and stenosis of bilateral carotid arteries: Secondary | ICD-10-CM | POA: Diagnosis not present

## 2018-01-16 LAB — COMPREHENSIVE METABOLIC PANEL
A/G RATIO: 1.6 (ref 1.2–2.2)
ALT: 48 IU/L — AB (ref 0–44)
AST: 34 IU/L (ref 0–40)
Albumin: 4.4 g/dL (ref 3.6–4.8)
Alkaline Phosphatase: 76 IU/L (ref 39–117)
BUN/Creatinine Ratio: 22 (ref 10–24)
BUN: 29 mg/dL — ABNORMAL HIGH (ref 8–27)
Bilirubin Total: 1.1 mg/dL (ref 0.0–1.2)
CALCIUM: 9.7 mg/dL (ref 8.6–10.2)
CO2: 21 mmol/L (ref 20–29)
Chloride: 101 mmol/L (ref 96–106)
Creatinine, Ser: 1.34 mg/dL — ABNORMAL HIGH (ref 0.76–1.27)
GFR calc Af Amer: 66 mL/min/{1.73_m2} (ref >59)
GFR, EST NON AFRICAN AMERICAN: 57 mL/min/{1.73_m2} — AB (ref >59)
GLOBULIN, TOTAL: 2.8 g/dL (ref 1.5–4.5)
Glucose: 96 mg/dL (ref 65–99)
POTASSIUM: 4.7 mmol/L (ref 3.5–5.2)
SODIUM: 137 mmol/L (ref 134–144)
Total Protein: 7.2 g/dL (ref 6.0–8.5)

## 2018-01-16 LAB — LIPID PANEL
CHOL/HDL RATIO: 3.2 ratio (ref 0.0–5.0)
CHOLESTEROL TOTAL: 118 mg/dL (ref 100–199)
HDL: 37 mg/dL — AB (ref >39)
LDL Calculated: 61 mg/dL (ref 0–99)
TRIGLYCERIDES: 98 mg/dL (ref 0–149)
VLDL Cholesterol Cal: 20 mg/dL (ref 5–40)

## 2018-01-16 MED ORDER — NITROGLYCERIN 0.4 MG SL SUBL: 0.4000 mg | SUBLINGUAL_TABLET | SUBLINGUAL | 6 refills | Status: DC | PRN

## 2018-01-16 MED ORDER — CLOPIDOGREL BISULFATE 75 MG PO TABS: 75.0000 mg | ORAL_TABLET | Freq: Every day | ORAL | 3 refills | Status: DC

## 2018-01-16 MED ORDER — AMLODIPINE BESYLATE 10 MG PO TABS: 10.0000 mg | ORAL_TABLET | Freq: Every day | ORAL | 3 refills | Status: DC

## 2018-01-16 MED ORDER — ATORVASTATIN CALCIUM 80 MG PO TABS: ORAL_TABLET | ORAL | 3 refills | Status: DC

## 2018-01-16 MED ORDER — LISINOPRIL 40 MG PO TABS: 40.0000 mg | ORAL_TABLET | Freq: Every day | ORAL | 3 refills | Status: DC

## 2018-01-16 MED ORDER — HYDROCHLOROTHIAZIDE 25 MG PO TABS: 25.0000 mg | ORAL_TABLET | Freq: Every day | ORAL | 3 refills | Status: DC

## 2018-01-16 NOTE — Patient Instructions (Signed)
Medication Instructions:  Your physician recommends that you continue on your current medications as directed. Please refer to the Current Medication list given to you today.   Labwork: Lab work to be done today--CMET and Lipid profile  Testing/Procedures: none  Follow-Up: Your physician recommends that you schedule a follow-up appointment in: 12 months. Please call our office in December or January to schedule this appointment    Any Other Special Instructions Will Be Listed Below (If Applicable).     If you need a refill on your cardiac medications before your next appointment, please call your pharmacy.

## 2018-01-17 NOTE — Addendum Note (Signed)
Addended by: Vernard GamblesOLE, Deya Bigos S on: 01/17/2018 07:58 AM   Modules accepted: Orders

## 2018-01-20 ENCOUNTER — Other Ambulatory Visit: Payer: Self-pay | Admitting: *Deleted

## 2018-01-20 DIAGNOSIS — I10 Essential (primary) hypertension: Secondary | ICD-10-CM

## 2018-01-20 DIAGNOSIS — I2510 Atherosclerotic heart disease of native coronary artery without angina pectoris: Secondary | ICD-10-CM

## 2018-01-20 NOTE — Progress Notes (Signed)
bm 

## 2018-01-24 ENCOUNTER — Other Ambulatory Visit: Payer: 59 | Admitting: *Deleted

## 2018-01-24 DIAGNOSIS — I2510 Atherosclerotic heart disease of native coronary artery without angina pectoris: Secondary | ICD-10-CM | POA: Diagnosis not present

## 2018-01-24 DIAGNOSIS — I10 Essential (primary) hypertension: Secondary | ICD-10-CM | POA: Diagnosis not present

## 2018-01-24 LAB — BASIC METABOLIC PANEL
BUN / CREAT RATIO: 19 (ref 10–24)
BUN: 24 mg/dL (ref 8–27)
CHLORIDE: 104 mmol/L (ref 96–106)
CO2: 21 mmol/L (ref 20–29)
Calcium: 9.5 mg/dL (ref 8.6–10.2)
Creatinine, Ser: 1.26 mg/dL (ref 0.76–1.27)
GFR calc non Af Amer: 61 mL/min/{1.73_m2} (ref >59)
GFR, EST AFRICAN AMERICAN: 71 mL/min/{1.73_m2} (ref >59)
Glucose: 93 mg/dL (ref 65–99)
Potassium: 4.5 mmol/L (ref 3.5–5.2)
Sodium: 140 mmol/L (ref 134–144)

## 2018-01-28 ENCOUNTER — Encounter

## 2018-03-19 ENCOUNTER — Encounter (HOSPITAL_COMMUNITY): Payer: Self-pay | Admitting: Emergency Medicine

## 2018-03-19 ENCOUNTER — Encounter: Payer: Self-pay | Admitting: Physician Assistant

## 2018-03-19 ENCOUNTER — Emergency Department (HOSPITAL_COMMUNITY)
Admission: EM | Admit: 2018-03-19 | Discharge: 2018-03-19 | Disposition: A | Discharge status: Home / Self Care | Payer: 59 | Attending: Emergency Medicine | Admitting: Emergency Medicine

## 2018-03-19 ENCOUNTER — Ambulatory Visit: Payer: 59 | Admitting: Physician Assistant

## 2018-03-19 ENCOUNTER — Ambulatory Visit (INDEPENDENT_AMBULATORY_CARE_PROVIDER_SITE_OTHER): Payer: 59

## 2018-03-19 ENCOUNTER — Telehealth: Payer: Self-pay | Admitting: Physician Assistant

## 2018-03-19 ENCOUNTER — Ambulatory Visit (HOSPITAL_COMMUNITY)
Admission: RE | Admit: 2018-03-19 | Discharge: 2018-03-19 | Disposition: A | Discharge status: Home / Self Care | Payer: 59 | Source: Ambulatory Visit | Attending: Physician Assistant | Admitting: Physician Assistant

## 2018-03-19 ENCOUNTER — Other Ambulatory Visit: Payer: Self-pay

## 2018-03-19 VITALS — BP 134/78 | HR 81 | Temp 97.8°F | Resp 18 | Ht 67.56 in | Wt 246.6 lb

## 2018-03-19 DIAGNOSIS — I2510 Atherosclerotic heart disease of native coronary artery without angina pectoris: Secondary | ICD-10-CM | POA: Insufficient documentation

## 2018-03-19 DIAGNOSIS — M545 Low back pain, unspecified: Secondary | ICD-10-CM

## 2018-03-19 DIAGNOSIS — F17220 Nicotine dependence, chewing tobacco, uncomplicated: Secondary | ICD-10-CM | POA: Diagnosis not present

## 2018-03-19 DIAGNOSIS — R109 Unspecified abdominal pain: Secondary | ICD-10-CM

## 2018-03-19 DIAGNOSIS — N132 Hydronephrosis with renal and ureteral calculous obstruction: Secondary | ICD-10-CM | POA: Insufficient documentation

## 2018-03-19 DIAGNOSIS — N201 Calculus of ureter: Secondary | ICD-10-CM | POA: Insufficient documentation

## 2018-03-19 DIAGNOSIS — R9389 Abnormal findings on diagnostic imaging of other specified body structures: Secondary | ICD-10-CM

## 2018-03-19 DIAGNOSIS — I10 Essential (primary) hypertension: Secondary | ICD-10-CM | POA: Diagnosis not present

## 2018-03-19 DIAGNOSIS — I700 Atherosclerosis of aorta: Secondary | ICD-10-CM | POA: Insufficient documentation

## 2018-03-19 DIAGNOSIS — Z79899 Other long term (current) drug therapy: Secondary | ICD-10-CM | POA: Insufficient documentation

## 2018-03-19 DIAGNOSIS — Z7902 Long term (current) use of antithrombotics/antiplatelets: Secondary | ICD-10-CM | POA: Insufficient documentation

## 2018-03-19 DIAGNOSIS — R3911 Hesitancy of micturition: Secondary | ICD-10-CM | POA: Diagnosis not present

## 2018-03-19 LAB — CBC WITH DIFFERENTIAL/PLATELET
BASOS PCT: 0 %
Basophils Absolute: 0 10*3/uL (ref 0.0–0.1)
EOS ABS: 0.1 10*3/uL (ref 0.0–0.7)
EOS PCT: 1 %
HCT: 43.8 % (ref 39.0–52.0)
Hemoglobin: 14.7 g/dL (ref 13.0–17.0)
LYMPHS ABS: 1.9 10*3/uL (ref 0.7–4.0)
Lymphocytes Relative: 15 %
MCH: 31.1 pg (ref 26.0–34.0)
MCHC: 33.6 g/dL (ref 30.0–36.0)
MCV: 92.6 fL (ref 78.0–100.0)
MONO ABS: 1.1 10*3/uL — AB (ref 0.1–1.0)
Monocytes Relative: 8 %
NEUTROS PCT: 76 %
Neutro Abs: 9.9 10*3/uL — ABNORMAL HIGH (ref 1.7–7.7)
PLATELETS: 284 10*3/uL (ref 150–400)
RBC: 4.73 MIL/uL (ref 4.22–5.81)
RDW: 13.6 % (ref 11.5–15.5)
WBC: 12.9 10*3/uL — ABNORMAL HIGH (ref 4.0–10.5)

## 2018-03-19 LAB — URINALYSIS, ROUTINE W REFLEX MICROSCOPIC
BACTERIA UA: NONE SEEN
BILIRUBIN URINE: NEGATIVE
Glucose, UA: NEGATIVE mg/dL
KETONES UR: NEGATIVE mg/dL
LEUKOCYTES UA: NEGATIVE
NITRITE: NEGATIVE
Protein, ur: NEGATIVE mg/dL
SPECIFIC GRAVITY, URINE: 1.005 (ref 1.005–1.030)
pH: 5 (ref 5.0–8.0)

## 2018-03-19 LAB — POCT CBC
Granulocyte percent: 81.8 %G — AB (ref 37–80)
HEMATOCRIT: 45.3 % (ref 43.5–53.7)
HEMOGLOBIN: 15 g/dL (ref 14.1–18.1)
LYMPH, POC: 1.7 (ref 0.6–3.4)
MCH: 30.2 pg (ref 27–31.2)
MCHC: 33.1 g/dL (ref 31.8–35.4)
MCV: 91.2 fL (ref 80–97)
MID (cbc): 0.6 (ref 0–0.9)
MPV: 9.2 fL (ref 0–99.8)
POC GRANULOCYTE: 10.3 — AB (ref 2–6.9)
POC LYMPH PERCENT: 13.3 %L (ref 10–50)
POC MID %: 4.9 % (ref 0–12)
Platelet Count, POC: 284 10*3/uL (ref 142–424)
RBC: 4.96 M/uL (ref 4.69–6.13)
RDW, POC: 13.4 %
WBC: 12.6 10*3/uL — AB (ref 4.6–10.2)

## 2018-03-19 LAB — COMPREHENSIVE METABOLIC PANEL
ALBUMIN: 4.2 g/dL (ref 3.5–5.0)
ALK PHOS: 81 U/L (ref 38–126)
ALT: 43 U/L (ref 17–63)
ANION GAP: 10 (ref 5–15)
AST: 30 U/L (ref 15–41)
BUN: 26 mg/dL — ABNORMAL HIGH (ref 6–20)
CALCIUM: 9.4 mg/dL (ref 8.9–10.3)
CO2: 24 mmol/L (ref 22–32)
Chloride: 103 mmol/L (ref 101–111)
Creatinine, Ser: 1.67 mg/dL — ABNORMAL HIGH (ref 0.61–1.24)
GFR calc non Af Amer: 43 mL/min — ABNORMAL LOW (ref >60)
GFR, EST AFRICAN AMERICAN: 49 mL/min — AB (ref >60)
GLUCOSE: 91 mg/dL (ref 65–99)
POTASSIUM: 4.3 mmol/L (ref 3.5–5.1)
Sodium: 137 mmol/L (ref 135–145)
Total Bilirubin: 1.9 mg/dL — ABNORMAL HIGH (ref 0.3–1.2)
Total Protein: 7.8 g/dL (ref 6.5–8.1)

## 2018-03-19 LAB — POC MICROSCOPIC URINALYSIS (UMFC): Mucus: ABSENT

## 2018-03-19 LAB — POCT URINALYSIS DIP (MANUAL ENTRY)
BILIRUBIN UA: NEGATIVE
BILIRUBIN UA: NEGATIVE mg/dL
GLUCOSE UA: NEGATIVE mg/dL
Nitrite, UA: NEGATIVE
Protein Ur, POC: NEGATIVE mg/dL
Spec Grav, UA: 1.01 (ref 1.010–1.025)
Urobilinogen, UA: 0.2 E.U./dL
pH, UA: 5.5 (ref 5.0–8.0)

## 2018-03-19 LAB — LIPASE, BLOOD: Lipase: 22 U/L (ref 11–51)

## 2018-03-19 MED ORDER — SODIUM CHLORIDE 0.9 % IV BOLUS
1000.0000 mL | Freq: Once | INTRAVENOUS | Status: AC
  Administered 2018-03-19: 1000 mL via INTRAVENOUS

## 2018-03-19 MED ORDER — MORPHINE SULFATE (PF) 4 MG/ML IV SOLN
4.0000 mg | Freq: Once | INTRAVENOUS | Status: AC
  Administered 2018-03-19: 4 mg via INTRAVENOUS
  Filled 2018-03-19: qty 1

## 2018-03-19 MED ORDER — TAMSULOSIN HCL 0.4 MG PO CAPS: 0.4000 mg | ORAL_CAPSULE | Freq: Every day | ORAL | 0 refills | Status: AC

## 2018-03-19 MED ORDER — ONDANSETRON 4 MG PO TBDP: 4.0000 mg | ORAL_TABLET | Freq: Three times a day (TID) | ORAL | 0 refills | Status: DC | PRN

## 2018-03-19 MED ORDER — TAMSULOSIN HCL 0.4 MG PO CAPS
0.4000 mg | ORAL_CAPSULE | Freq: Once | ORAL | Status: AC
  Administered 2018-03-19: 0.4 mg via ORAL
  Filled 2018-03-19: qty 1

## 2018-03-19 MED ORDER — OXYCODONE-ACETAMINOPHEN 5-325 MG PO TABS: 2.0000 | ORAL_TABLET | ORAL | 0 refills | Status: AC | PRN

## 2018-03-19 MED ORDER — ONDANSETRON HCL 4 MG/2ML IJ SOLN
4.0000 mg | Freq: Once | INTRAMUSCULAR | Status: AC
  Administered 2018-03-19: 4 mg via INTRAVENOUS
  Filled 2018-03-19: qty 2

## 2018-03-19 NOTE — ED Triage Notes (Signed)
Confirmed right sided kidney stone.

## 2018-03-19 NOTE — ED Provider Notes (Addendum)
[facility]-EMERGENCY DEPT Provider Note   CSN: [id number] Arrival date & time: 03/19/18  1626     History   Chief Complaint Chief Complaint  Patient presents with  . Flank Pain    HPI YOMAR MEJORADO is a 62 y.o. male with history of STEMI status post stents, hypertension, hyperlipidemia, obesity, carotid artery disease is here for right flank pain onset yesterday.  Pain is 10/10 but intermittently now improving to an 8/10.  Constant, nonradiating.  Associated with chills, nausea and urinary hesitancy.  He denies vomiting, frontal abdominal pain, dysuria, hematuria, penile discharge, testicular pain.  Feels similar to previous kidney stone.  He was seen by PCP earlier today and had a CT scan that confirmed right ureteral stone.  HPI  Past Medical History:  Diagnosis Date  . Arthritis   . CAD (coronary artery disease), native coronary artery    07/29/15 Inferior infarct Cath showed normal left main, 30% diagonal, 60% distal LAD, 30% circumflex, occluded right coronary artery 4.0 x 38 mm and 4.0 x 16 mm Synergy drug-eluting stents to right coronary artery Dr. Sanjuana Kava   . Hyperglycemia   . Hyperlipidemia 07/31/2015  . Hypertension   . Obesity     Patient Active Problem List   Diagnosis Date Noted  . Sinus bradycardia 08/15/2015  . Bilateral carotid bruits 08/15/2015  . CAD (coronary artery disease), native coronary artery   . Hyperlipidemia   . ST elevation myocardial infarction (STEMI) of inferior wall (HCC) 07/29/2015  . Essential hypertension 07/29/2015  . Hyperglycemia 07/29/2015  . Obesity   . Osteoarthritis of left knee 12/17/2014    Past Surgical History:  Procedure Laterality Date  . CARDIAC CATHETERIZATION N/A 07/29/2015   Procedure: Left Heart Cath and Coronary Angiography;  Surgeon: Kathleene Hazel, MD;  Location: [facility] INVASIVE CV LAB;  Service: Cardiovascular;  Laterality: N/A;  . CARDIAC CATHETERIZATION N/A 07/29/2015   Procedure:  Coronary Stent Intervention;  Surgeon: Kathleene Hazel, MD;  Location: [facility] INVASIVE CV LAB;  Service: Cardiovascular;  Laterality: N/A;  . TOTAL KNEE ARTHROPLASTY Left 12/17/2014   Procedure: LEFT TOTAL KNEE ARTHROPLASTY;  Surgeon: Kathryne Hitch, MD;  Location: [facility] ORS;  Service: Orthopedics;  Laterality: Left;        Home Medications    Prior to Admission medications   Medication Sig Start Date End Date Taking? Authorizing Provider  amLODipine (NORVASC) 10 MG tablet Take 1 tablet (10 mg total) by mouth daily. 01/16/18  Yes Kathleene Hazel, MD  atorvastatin (LIPITOR) 80 MG tablet TAKE 1 TABLET BY MOUTH DAILY AT 6 PM. 3 01/16/18  Yes Kathleene Hazel, MD  clopidogrel (PLAVIX) 75 MG tablet Take 1 tablet (75 mg total) by mouth daily. 01/16/18  Yes Kathleene Hazel, MD  hydrochlorothiazide (HYDRODIURIL) 25 MG tablet Take 1 tablet (25 mg total) by mouth daily. 01/16/18  Yes Kathleene Hazel, MD  lisinopril (PRINIVIL,ZESTRIL) 40 MG tablet Take 1 tablet (40 mg total) by mouth daily. 01/16/18  Yes Kathleene Hazel, MD  nitroGLYCERIN (NITROSTAT) 0.4 MG SL tablet Place 1 tablet (0.4 mg total) under the tongue every 5 (five) minutes as needed for chest pain. 01/16/18   Kathleene Hazel, MD  ondansetron (ZOFRAN ODT) 4 MG disintegrating tablet Take 1 tablet (4 mg total) by mouth every 8 (eight) hours as needed for nausea or vomiting. 03/19/18   Liberty Handy, PA-C  oxyCODONE-acetaminophen (PERCOCET/ROXICET) 5-325 MG tablet Take 2 tablets by mouth every 4 (four) hours  as needed for up to 3 days for severe pain. 03/19/18 03/22/18  Liberty Handy, PA-C  tamsulosin (FLOMAX) 0.4 MG CAPS capsule Take 1 capsule (0.4 mg total) by mouth daily. Once daily until stone passage or for up to 4 weeks 03/19/18 04/18/18  Liberty Handy, PA-C  triamcinolone cream (KENALOG) 0.1 % Apply 1 application topically 3 (three) times daily. Patient not taking: Reported on  03/19/2018 07/27/16   Kathleene Hazel, MD    Family History Family History  Problem Relation Age of Onset  . CAD Mother        Mom had heart problems in her 47s  . Heart attack Father   . Stroke Father   . Hypertension Father     Social History Social History   Tobacco Use  . Smoking status: Never Smoker  . Smokeless tobacco: Current User    Types: Snuff  Substance Use Topics  . Alcohol use: Yes    Alcohol/week: 0.0 oz    Comment: occassionally - but has not had anything to drink in several months  . Drug use: No     Allergies   Metoprolol   Review of Systems Review of Systems  Constitutional: Positive for chills.  Gastrointestinal: Positive for nausea.  Genitourinary: Positive for difficulty urinating and flank pain.  All other systems reviewed and are negative.    Physical Exam Updated Vital Signs BP (!) 95/51 (BP Location: Left Arm)   Pulse (!) 57   Temp 98.2 F (36.8 C) (Oral)   Resp 20   SpO2 96%   Physical Exam  Constitutional: He is oriented to person, place, and time. He appears well-developed and well-nourished. No distress.  Non toxic.  Asleep but easily arousable.  HENT:  Head: Normocephalic and atraumatic.  Nose: Nose normal.  Moist mucous membranes   Eyes: Pupils are equal, round, and reactive to light. Conjunctivae and EOM are normal.  Neck: Normal range of motion.  Cardiovascular: Normal rate, regular rhythm, normal heart sounds and intact distal pulses.  2+ DP and radial pulses bilaterally. No LE edema.   Pulmonary/Chest: Effort normal and breath sounds normal.  Abdominal: Soft. Bowel sounds are normal. There is no tenderness.  No G/R/R. No suprapubic or CVA tenderness. Negative Murphy's and McBurney's  Musculoskeletal: Normal range of motion.  Neurological: He is alert and oriented to person, place, and time.  Skin: Skin is warm and dry. Capillary refill takes less than 2 seconds.  Psychiatric: He has a normal mood and affect.  His behavior is normal. Judgment and thought content normal.  Nursing note and vitals reviewed.    ED Treatments / Results  Labs (all labs ordered are listed, but only abnormal results are displayed) Labs Reviewed  CBC WITH DIFFERENTIAL/PLATELET - Abnormal; Notable for the following components:      Result Value   WBC 12.9 (*)    Neutro Abs 9.9 (*)    Monocytes Absolute 1.1 (*)    All other components within normal limits  COMPREHENSIVE METABOLIC PANEL - Abnormal; Notable for the following components:   BUN 26 (*)    Creatinine, Ser 1.67 (*)    Total Bilirubin 1.9 (*)    GFR calc non Af Amer 43 (*)    GFR calc Af Amer 49 (*)    All other components within normal limits  URINALYSIS, ROUTINE W REFLEX MICROSCOPIC - Abnormal; Notable for the following components:   Color, Urine STRAW (*)    Hgb urine dipstick MODERATE (*)  All other components within normal limits  URINE CULTURE  LIPASE, BLOOD    EKG None  Radiology Dg Abd 1 View  Result Date: 03/19/2018 CLINICAL DATA:  Right-sided low back pain for the past day. History of kidney stones. EXAM: ABDOMEN - 1 VIEW COMPARISON:  None. FINDINGS: The bowel gas pattern is normal. There is an 8 mm oval radiopaque density in the right pelvis. No acute osseous abnormality. Degenerative changes at L4-L5. IMPRESSION: 1. 8 mm oval radiopaque density in the right pelvis, possibly a distal ureteral calculus. Consider noncontrast CT for further evaluation. Electronically Signed   By: Obie Dredge M.D.   On: 03/19/2018 14:20   Ct Renal Stone Study  Result Date: 03/19/2018 CLINICAL DATA:  Right flank pain for 1 day with a history of kidney stones. EXAM: CT ABDOMEN AND PELVIS WITHOUT CONTRAST TECHNIQUE: Multidetector CT imaging of the abdomen and pelvis was performed following the standard protocol without IV contrast. COMPARISON:  None. FINDINGS: Lower chest: Lung bases are clear. Heart size normal. Coronary artery and aortic valvular  calcification. No pericardial or pleural effusion. Distal esophagus is grossly unremarkable. Hepatobiliary: Liver and gallbladder are unremarkable. No biliary ductal dilatation. Pancreas: Negative. Spleen: Negative. Adrenals/Urinary Tract: Right adrenal gland is unremarkable. Slight nodular thickening of the body of the left adrenal gland. Right renal edema with a punctate stone in the right kidney. Moderate to severe right hydronephrosis secondary to an 8 mm stone in the distal right ureter. Left kidney is unremarkable. Left ureter is decompressed. Bladder is low in volume. Stomach/Bowel: Stomach, small bowel, appendix and colon are unremarkable. Vascular/Lymphatic: Atherosclerotic calcification of the arterial vasculature without abdominal aortic aneurysm. No pathologically enlarged lymph nodes. Reproductive: Prostate is visualized. Other: No free fluid.  Mesenteries and peritoneum are unremarkable. Musculoskeletal: Mild degenerative changes in the spine. No worrisome lytic or sclerotic lesions. IMPRESSION: 1. Moderate to severe right hydronephrosis secondary to an 8 mm stone in the lower right ureter. 2. Punctate right renal stone. 3. Aortic atherosclerosis (ICD10-170.0). Coronary artery calcification. Electronically Signed   By: Leanna Battles M.D.   On: 03/19/2018 15:37    Procedures Procedures (including critical care time)  Medications Ordered in ED Medications  sodium chloride 0.9 % bolus 1,000 mL (0 mLs Intravenous Stopped 03/19/18 2010)  morphine 4 MG/ML injection 4 mg (4 mg Intravenous Given 03/19/18 1816)  ondansetron (ZOFRAN) injection 4 mg (4 mg Intravenous Given 03/19/18 1816)  tamsulosin (FLOMAX) capsule 0.4 mg (0.4 mg Oral Given 03/19/18 1906)     Initial Impression / Assessment and Plan / ED Course  I have reviewed the triage vital signs and the nursing notes.  Pertinent labs & imaging results that were available during my care of the patient were reviewed by me and considered in my  medical decision making (see chart for details).  Clinical Course as of Mar 19 2110  Wed Mar 19, 2018  1913 WBC(!): 12.9 [CG]  1913 Hgb urine dipstickMarland Kitchen): MODERATE [CG]  1913 Creatinine(!): 1.67 [CG]  1913 GFR, Est Non African American(!): 43 [CG]    Clinical Course User Index [CG] Liberty Handy, PA-C   We will obtain screening labs to check WBC and creatinine. IVF, morphine, zofran, flomax ordered.  CT scan done by PCP confirms moderate to severe right hydronephrosis secondary to an 8 mm stone in the distal right ureter.  Exam as above is very reassuring, he has no abdominal tenderness or CVA tenderness.  Nontoxic.  Still endorsing 8/10 pain and nausea.  Pending  labs, anticipate urology consult.  Final Clinical Impressions(s) / ED Diagnoses   2107: WBC 12.9 and creatinine 1.67.  UA without signs of infection. Spoke to urology who has reviewed pt's chart. He recommends discharge with f/u in office in 1-2 weeks.  Agrees with dc with urine strainer, analgesia, zofran, flomax.  Pt feels much better after morphine, continues to void without issues. Discussed plan to d/c with pt and family at bedside, they are comfortable with this plan. They are to call [facility] tomorrow morning to confirm appointment.   Old records, if available, reviewed by me. Imaging and labs viewed and interpreted by me and used in the medical decision making (formal interpretation from radiologist). Discharge home in stable condition, return precautions discussed. Patient, family agreeable with plan for discharge home. Final diagnoses:  Ureterolithiasis  Hydronephrosis with ureteral calculus    ED Discharge Orders        Ordered    tamsulosin (FLOMAX) 0.4 MG CAPS capsule  Daily     03/19/18 1939    oxyCODONE-acetaminophen (PERCOCET/ROXICET) 5-325 MG tablet  Every 4 hours PRN     03/19/18 1939    ondansetron (ZOFRAN ODT) 4 MG disintegrating tablet  Every 8 hours PRN     03/19/18 1939         Liberty HandyGibbons,  Claudia J, PA-C 03/19/18 2111    Charlynne PanderYao, David Hsienta, MD 03/19/18 2224

## 2018-03-19 NOTE — Patient Instructions (Addendum)
Please go to: [facility] -Radiology Dept. We will contact you with what your medication management is going to be.      IF you received an x-ray today, you will receive an invoice from [facility]. Please contact [facility] at [phone] with questions or concerns regarding your invoice.   IF you received labwork today, you will receive an invoice from [facility]. Please contact [facility] at [phone] with questions or concerns regarding your invoice.   Our billing staff will not be able to assist you with questions regarding bills from these companies.  You will be contacted with the lab results as soon as they are available. The fastest way to get your results is to activate your My Chart account. Instructions are located on the last page of this paperwork. If you have not heard from us regarding the results in 2 weeks, please contact this office.

## 2018-03-19 NOTE — Telephone Encounter (Signed)
[place], from [facility] calling with report of CT renal study and wanting to know if pt needed to stay or if he was ok to leave. Results available in Epic. Spoke with Benjiman CoreBrittany Wiseman, PA who was notified of results. Notified [place]ierra to tell the pt to stay until he hears back from [place]ittany. [place]Brittany states she will contact the pt regarding recommendations. [facility] can be contacted at [phone]

## 2018-03-19 NOTE — Discharge Instructions (Signed)
CT shows 8 mm stone in your right ureter with some associated kidney swelling.   Stay well hydrated. Use zofran for nausea as needed. Take flomax once daily until passage of stone or up to 3-4 weeks total, this may be modified by urologist when you see them in the office. Strain all your urine. For pain, take 500 mg acetaminophen (tylenol) every 8 hours for mild to moderate pain, for severe pain you can use percocet.   Return to the ER for fevers, chills, persistent nausea and vomiting, burning with urination, inability to void urine, worsening pain.  Call urology tomorrow morning to set up an appointment within 1-2 weeks.

## 2018-03-19 NOTE — Progress Notes (Signed)
Donald Wood  MRN: [id number] DOB: 03-Aug-1956  Subjective:  Donald Wood is a 62 y.o. male seen in office today for a chief complaint of low back pain. The patient has had no prior back problems. Symptoms have been present for 1 day and are unchanged.  Onset was related to / precipitated by no known injury. The pain is located in the right mid/low back and radiates to the abdomen and testicle. The pain is described as throbbing and occurs intermittently. He is currently in no pain. Symptoms are exacerbated by nothing in particular. Symptoms are improved by nothing.  He denies weakness in the right leg, weakness in the left leg, tingling in the right leg, tingling in the left leg, burning pain in the right leg, burning pain in the left leg, urinary hesitancy, urinary incontinence, urinary retention, bowel incontinence, constipation, impotence and groin/perineal numbness associated with the back pain. Has PMH of kidney stones. Notes this reminds him of when he had a kidney stone.   Review of Systems  Constitutional: Negative for chills, diaphoresis and fever.  Respiratory: Negative for cough and shortness of breath.   Cardiovascular: Negative for chest pain and palpitations.  Gastrointestinal: Positive for nausea. Negative for vomiting.  Genitourinary: Negative for dysuria, hematuria and urgency.    Patient Active Problem List   Diagnosis Date Noted  . Sinus bradycardia 08/15/2015  . Bilateral carotid bruits 08/15/2015  . CAD (coronary artery disease), native coronary artery   . Hyperlipidemia   . ST elevation myocardial infarction (STEMI) of inferior wall (HCC) 07/29/2015  . Essential hypertension 07/29/2015  . Hyperglycemia 07/29/2015  . Obesity   . Osteoarthritis of left knee 12/17/2014    Current Outpatient Medications on File Prior to Visit  Medication Sig Dispense Refill  . amLODipine (NORVASC) 10 MG tablet Take 1 tablet (10 mg total) by mouth daily. 90 tablet 3  . atorvastatin  (LIPITOR) 80 MG tablet TAKE 1 TABLET BY MOUTH DAILY AT 6 PM. 3 90 tablet 3  . clopidogrel (PLAVIX) 75 MG tablet Take 1 tablet (75 mg total) by mouth daily. 90 tablet 3  . hydrochlorothiazide (HYDRODIURIL) 25 MG tablet Take 1 tablet (25 mg total) by mouth daily. 90 tablet 3  . lisinopril (PRINIVIL,ZESTRIL) 40 MG tablet Take 1 tablet (40 mg total) by mouth daily. 90 tablet 3  . nitroGLYCERIN (NITROSTAT) 0.4 MG SL tablet Place 1 tablet (0.4 mg total) under the tongue every 5 (five) minutes as needed for chest pain. 25 tablet 6  . triamcinolone cream (KENALOG) 0.1 % Apply 1 application topically 3 (three) times daily. 30 g 0   No current facility-administered medications on file prior to visit.     Allergies  Allergen Reactions  . Metoprolol Other (See Comments)    Pt trialed on metoprolol and carvedilol - bradycardic with HR 40-50 on both beta blockers.      Social History   Socioeconomic History  . Marital status: Married    Spouse name: Not on file  . Number of children: 2  . Years of education: Not on file  . Highest education level: Not on file  Occupational History  . Not on file  Social Needs  . Financial resource strain: Not on file  . Food insecurity:    Worry: Not on file    Inability: Not on file  . Transportation needs:    Medical: Not on file    Non-medical: Not on file  Tobacco Use  . Smoking status:  Never Smoker  . Smokeless tobacco: Current User    Types: Snuff  Substance and Sexual Activity  . Alcohol use: Yes    Alcohol/week: 0.0 oz    Comment: occassionally - but has not had anything to drink in several months  . Drug use: No  . Sexual activity: Yes    Partners: Female  Lifestyle  . Physical activity:    Days per week: Not on file    Minutes per session: Not on file  . Stress: Not on file  Relationships  . Social connections:    Talks on phone: Not on file    Gets together: Not on file    Attends religious service: Not on file    Active member of  club or organization: Not on file    Attends meetings of clubs or organizations: Not on file    Relationship status: Not on file  . Intimate partner violence:    Fear of current or ex partner: Not on file    Emotionally abused: Not on file    Physically abused: Not on file    Forced sexual activity: Not on file  Other Topics Concern  . Not on file  Social History Narrative  . Not on file    Objective:  BP 134/78 (BP Location: Left Arm, Patient Position: Sitting, Cuff Size: Large)   Pulse 81   Temp 97.8 F (36.6 C) (Oral)   Resp 18   Ht 5' 7.56" (1.716 m)   Wt 246 lb 9.6 oz (111.9 kg)   SpO2 97%   BMI 37.99 kg/m   Physical Exam  Constitutional: He is oriented to person, place, and time. He appears well-developed and well-nourished. No distress.  HENT:  Head: Normocephalic and atraumatic.  Eyes: Conjunctivae are normal.  Neck: Normal range of motion.  Pulmonary/Chest: Effort normal.  Abdominal: Soft. Normal appearance and bowel sounds are normal. There is no tenderness. There is no CVA tenderness.  Musculoskeletal:       Thoracic back: Normal.       Lumbar back: He exhibits tenderness and spasm. He exhibits normal range of motion, no bony tenderness and no swelling.       Back:  Neurological: He is alert and oriented to person, place, and time.  Reflex Scores:      Patellar reflexes are 2+ on the right side and 2+ on the left side.      Achilles reflexes are 2+ on the right side and 2+ on the left side. Strength 5/5 of BLE Sensation of BLE intact Negative SLR bilaterally   Skin: Skin is warm and dry.  Psychiatric: He has a normal mood and affect.  Vitals reviewed.  Results for orders placed or performed in visit on 03/19/18 (from the past 24 hour(s))  POCT urinalysis dipstick     Status: Abnormal   Collection Time: 03/19/18  2:03 PM  Result Value Ref Range   Color, UA yellow yellow   Clarity, UA clear clear   Glucose, UA negative negative mg/dL   Bilirubin, UA  negative negative   Ketones, POC UA negative negative mg/dL   Spec Grav, UA 0.865 7.846 - 1.025   Blood, UA small (A) negative   pH, UA 5.5 5.0 - 8.0   Protein Ur, POC negative negative mg/dL   Urobilinogen, UA 0.2 0.2 or 1.0 E.U./dL   Nitrite, UA Negative Negative   Leukocytes, UA Trace (A) Negative  POCT Microscopic Urinalysis ([facility])     Status: None  Collection Time: 03/19/18  2:46 PM  Result Value Ref Range   WBC,UR,HPF,POC None None WBC/hpf   RBC,UR,HPF,POC None None RBC/hpf   Bacteria None None, Too numerous to count   Mucus Absent Absent   Epithelial Cells, UR Per Microscopy None None, Too numerous to count cells/hpf  POCT CBC     Status: Abnormal   Collection Time: 03/19/18  2:46 PM  Result Value Ref Range   WBC 12.6 (A) 4.6 - 10.2 K/uL   Lymph, poc 1.7 0.6 - 3.4   POC LYMPH PERCENT 13.3 10 - 50 %L   MID (cbc) 0.6 0 - 0.9   POC MID % 4.9 0 - 12 %M   POC Granulocyte 10.3 (A) 2 - 6.9   Granulocyte percent 81.8 (A) 37 - 80 %G   RBC 4.96 4.69 - 6.13 M/uL   Hemoglobin 15.0 14.1 - 18.1 g/dL   HCT, POC 16.145.3 09.643.5 - 53.7 %   MCV 91.2 80 - 97 fL   MCH, POC 30.2 27 - 31.2 pg   MCHC 33.1 31.8 - 35.4 g/dL   RDW, POC 04.513.4 %   Platelet Count, POC 284 142 - 424 K/uL   MPV 9.2 0 - 99.8 fL   Dg Abd 1 View  Result Date: 03/19/2018 CLINICAL DATA:  Right-sided low back pain for the past day. History of kidney stones. EXAM: ABDOMEN - 1 VIEW COMPARISON:  None. FINDINGS: The bowel gas pattern is normal. There is an 8 mm oval radiopaque density in the right pelvis. No acute osseous abnormality. Degenerative changes at L4-L5. IMPRESSION: 1. 8 mm oval radiopaque density in the right pelvis, possibly a distal ureteral calculus. Consider noncontrast CT for further evaluation. Electronically Signed   By: Obie DredgeWilliam T Derry M.D.   On: 03/19/2018 14:20    Assessment and Plan :  1. Acute right-sided low back pain without sciatica Hx and plain films consistent with kidney stone. Urine micro with no  RBC. PE shows reproducible pain with palpation of muscle spasms in right lumbar musculature, pt notes this is similar to the pain he is experience but not the exact pain. No CVA tenderness. Suspect pt has 2 sources of pain. WBC mildly elevated at 12.6. Vitals stable. Plain films with 8mm stone. Sent for STAT CT renal study. Will contact pt with image results and discuss further tx plan.  - DG Abd 1 View; Future - POCT urinalysis dipstick - POCT Microscopic Urinalysis ([facility]) - POCT CBC  2. Abnormal x-ray - CT RENAL STONE STUDY; Future  Benjiman CoreBrittany Wiseman PA-C  [facility]  [facility] 03/19/2018 3:03 PM

## 2018-03-21 LAB — URINE CULTURE: CULTURE: NO GROWTH

## 2018-04-12 ENCOUNTER — Other Ambulatory Visit: Payer: Self-pay | Admitting: Cardiovascular Disease

## 2018-07-14 ENCOUNTER — Emergency Department (HOSPITAL_COMMUNITY): Payer: 59

## 2018-07-14 ENCOUNTER — Encounter (HOSPITAL_COMMUNITY): Payer: Self-pay | Admitting: Family Medicine

## 2018-07-14 ENCOUNTER — Emergency Department (HOSPITAL_COMMUNITY)
Admission: EM | Admit: 2018-07-14 | Discharge: 2018-07-14 | Disposition: A | Discharge status: Home / Self Care | Payer: 59 | Attending: Emergency Medicine | Admitting: Emergency Medicine

## 2018-07-14 DIAGNOSIS — N201 Calculus of ureter: Secondary | ICD-10-CM

## 2018-07-14 DIAGNOSIS — Z96652 Presence of left artificial knee joint: Secondary | ICD-10-CM | POA: Diagnosis not present

## 2018-07-14 DIAGNOSIS — I10 Essential (primary) hypertension: Secondary | ICD-10-CM | POA: Diagnosis not present

## 2018-07-14 DIAGNOSIS — R1011 Right upper quadrant pain: Secondary | ICD-10-CM | POA: Diagnosis present

## 2018-07-14 DIAGNOSIS — R109 Unspecified abdominal pain: Secondary | ICD-10-CM | POA: Diagnosis not present

## 2018-07-14 DIAGNOSIS — K5730 Diverticulosis of large intestine without perforation or abscess without bleeding: Secondary | ICD-10-CM | POA: Diagnosis not present

## 2018-07-14 DIAGNOSIS — N132 Hydronephrosis with renal and ureteral calculous obstruction: Secondary | ICD-10-CM | POA: Diagnosis not present

## 2018-07-14 DIAGNOSIS — I2510 Atherosclerotic heart disease of native coronary artery without angina pectoris: Secondary | ICD-10-CM | POA: Insufficient documentation

## 2018-07-14 LAB — BASIC METABOLIC PANEL
Anion gap: 8 (ref 5–15)
BUN: 26 mg/dL — ABNORMAL HIGH (ref 8–23)
CALCIUM: 9.3 mg/dL (ref 8.9–10.3)
CHLORIDE: 109 mmol/L (ref 98–111)
CO2: 23 mmol/L (ref 22–32)
CREATININE: 1.29 mg/dL — AB (ref 0.61–1.24)
GFR calc non Af Amer: 58 mL/min — ABNORMAL LOW (ref >60)
GLUCOSE: 104 mg/dL — AB (ref 70–99)
Potassium: 4.5 mmol/L (ref 3.5–5.1)
Sodium: 140 mmol/L (ref 135–145)

## 2018-07-14 LAB — URINALYSIS, ROUTINE W REFLEX MICROSCOPIC
BILIRUBIN URINE: NEGATIVE
Bacteria, UA: NONE SEEN
GLUCOSE, UA: NEGATIVE mg/dL
KETONES UR: NEGATIVE mg/dL
LEUKOCYTES UA: NEGATIVE
NITRITE: NEGATIVE
PH: 5 (ref 5.0–8.0)
Protein, ur: NEGATIVE mg/dL
SPECIFIC GRAVITY, URINE: 1.019 (ref 1.005–1.030)

## 2018-07-14 LAB — CBC WITH DIFFERENTIAL/PLATELET
Abs Immature Granulocytes: 0.04 10*3/uL (ref 0.00–0.07)
BASOS ABS: 0 10*3/uL (ref 0.0–0.1)
Basophils Relative: 0 %
EOS PCT: 0 %
Eosinophils Absolute: 0 10*3/uL (ref 0.0–0.5)
HCT: 41.3 % (ref 39.0–52.0)
HEMOGLOBIN: 13.3 g/dL (ref 13.0–17.0)
IMMATURE GRANULOCYTES: 0 %
LYMPHS PCT: 9 %
Lymphs Abs: 1.1 10*3/uL (ref 0.7–4.0)
MCH: 30.9 pg (ref 26.0–34.0)
MCHC: 32.2 g/dL (ref 30.0–36.0)
MCV: 95.8 fL (ref 80.0–100.0)
MONOS PCT: 5 %
Monocytes Absolute: 0.6 10*3/uL (ref 0.1–1.0)
NRBC: 0 % (ref 0.0–0.2)
Neutro Abs: 10.5 10*3/uL — ABNORMAL HIGH (ref 1.7–7.7)
Neutrophils Relative %: 86 %
Platelets: 267 10*3/uL (ref 150–400)
RBC: 4.31 MIL/uL (ref 4.22–5.81)
RDW: 13.3 % (ref 11.5–15.5)
WBC: 12.3 10*3/uL — ABNORMAL HIGH (ref 4.0–10.5)

## 2018-07-14 MED ORDER — ONDANSETRON HCL 4 MG/2ML IJ SOLN
4.0000 mg | Freq: Once | INTRAMUSCULAR | Status: AC
  Administered 2018-07-14: 4 mg via INTRAVENOUS
  Filled 2018-07-14: qty 2

## 2018-07-14 MED ORDER — ONDANSETRON 4 MG PO TBDP: 4.0000 mg | ORAL_TABLET | Freq: Three times a day (TID) | ORAL | 0 refills | Status: DC | PRN

## 2018-07-14 MED ORDER — TAMSULOSIN HCL 0.4 MG PO CAPS: 0.4000 mg | ORAL_CAPSULE | Freq: Every day | ORAL | 0 refills | Status: AC

## 2018-07-14 MED ORDER — IBUPROFEN 800 MG PO TABS: 800.0000 mg | ORAL_TABLET | Freq: Three times a day (TID) | ORAL | 0 refills | Status: DC | PRN

## 2018-07-14 MED ORDER — OXYCODONE-ACETAMINOPHEN 5-325 MG PO TABS: 1.0000 | ORAL_TABLET | Freq: Four times a day (QID) | ORAL | 0 refills | Status: DC | PRN

## 2018-07-14 MED ORDER — KETOROLAC TROMETHAMINE 30 MG/ML IJ SOLN
30.0000 mg | Freq: Once | INTRAMUSCULAR | Status: AC
  Administered 2018-07-14: 30 mg via INTRAVENOUS
  Filled 2018-07-14: qty 1

## 2018-07-14 NOTE — Discharge Instructions (Signed)
You have been seen in the Emergency Department (ED) today for pain that we believe based on your workup, is caused by kidney stones.  As we have discussed, please drink plenty of fluids.  Please make a follow up appointment with the physician(s) listed elsewhere in this documentation. ° °You may take pain medication as needed but ONLY as prescribed.  Please also take your prescribed Flomax daily.  We also recommend that you take over-the-counter ibuprofen regularly according to label instructions over the next 5 days.  Take it with meals to minimize stomach discomfort. ° °Please see your doctor as soon as possible as stones may take 1-3 weeks to pass and you may require additional care or medications. ° °Do not drink alcohol, drive or participate in any other potentially dangerous activities while taking opiate pain medication as it may make you sleepy. Do not take this medication with any other sedating medications, either prescription or over-the-counter. If you were prescribed Percocet or Vicodin, do not take these with acetaminophen (Tylenol) as it is already contained within these medications. °  °Take Percocet as needed for severe pain.  This medication is an opiate (or narcotic) pain medication and can be habit forming.  Use it as little as possible to achieve adequate pain control.  Do not use or use it with extreme caution if you have a history of opiate abuse or dependence.  If you are on a pain contract with your primary care doctor or a pain specialist, be sure to let them know you were prescribed this medication today from the Emergency Department.  This medication is intended for your use only - do not give any to anyone else and keep it in a secure place where nobody else, especially children, have access to it.  It will also cause or worsen constipation, so you may want to consider taking an over-the-counter stool softener while you are taking this medication. ° °Return to the Emergency Department  (ED) or call your doctor if you have any worsening pain, fever, painful urination, are unable to urinate, or develop other symptoms that concern you. ° ° ° °Kidney Stones °Kidney stones (urolithiasis) are deposits that form inside your kidneys. The intense pain is caused by the stone moving through the urinary tract. When the stone moves, the ureter goes into spasm around the stone. The stone is usually passed in the urine.  °CAUSES  °A disorder that makes certain neck glands produce too much parathyroid hormone (primary hyperparathyroidism). °A buildup of uric acid crystals, similar to gout in your joints. °Narrowing (stricture) of the ureter. °A kidney obstruction present at birth (congenital obstruction). °Previous surgery on the kidney or ureters. °Numerous kidney infections. °SYMPTOMS  °Feeling sick to your stomach (nauseous). °Throwing up (vomiting). °Blood in the urine (hematuria). °Pain that usually spreads (radiates) to the groin. °Frequency or urgency of urination. °DIAGNOSIS  °Taking a history and physical exam. °Blood or urine tests. °CT scan. °Occasionally, an examination of the inside of the urinary bladder (cystoscopy) is performed. °TREATMENT  °Observation. °Increasing your fluid intake. °Extracorporeal shock wave lithotripsy--This is a noninvasive procedure that uses shock waves to break up kidney stones. °Surgery may be needed if you have severe pain or persistent obstruction. There are various surgical procedures. Most of the procedures are performed with the use of small instruments. Only small incisions are needed to accommodate these instruments, so recovery time is minimized. °The size, location, and chemical composition are all important variables that will determine the proper   choice of action for you. Talk to your health care provider to better understand your situation so that you will minimize the risk of injury to yourself and your kidney.  °HOME CARE INSTRUCTIONS  °Drink enough water  and fluids to keep your urine clear or pale yellow. This will help you to pass the stone or stone fragments. °Strain all urine through the provided strainer. Keep all particulate matter and stones for your health care provider to see. The stone causing the pain may be as small as a grain of salt. It is very important to use the strainer each and every time you pass your urine. The collection of your stone will allow your health care provider to analyze it and verify that a stone has actually passed. The stone analysis will often identify what you can do to reduce the incidence of recurrences. °Only take over-the-counter or prescription medicines for pain, discomfort, or fever as directed by your health care provider. °Keep all follow-up visits as told by your health care provider. This is important. °Get follow-up X-rays if required. The absence of pain does not always mean that the stone has passed. It may have only stopped moving. If the urine remains completely obstructed, it can cause loss of kidney function or even complete destruction of the kidney. It is your responsibility to make sure X-rays and follow-ups are completed. Ultrasounds of the kidney can show blockages and the status of the kidney. Ultrasounds are not associated with any radiation and can be performed easily in a matter of minutes. °Make changes to your daily diet as told by your health care provider. You may be told to: °Limit the amount of salt that you eat. °Eat 5 or more servings of fruits and vegetables each day. °Limit the amount of meat, poultry, fish, and eggs that you eat. °Collect a 24-hour urine sample as told by your health care provider. You may need to collect another urine sample every 6-12 months. °SEEK MEDICAL CARE IF: °You experience pain that is progressive and unresponsive to any pain medicine you have been prescribed. °SEEK IMMEDIATE MEDICAL CARE IF:  °Pain cannot be controlled with the prescribed medicine. °You have a  fever or shaking chills. °The severity or intensity of pain increases over 18 hours and is not relieved by pain medicine. °You develop a new onset of abdominal pain. °You feel faint or pass out. °You are unable to urinate. °  °This information is not intended to replace advice given to you by your health care provider. Make sure you discuss any questions you have with your health care provider. °  °Document Released: 09/17/2005 Document Revised: 06/08/2015 Document Reviewed: 02/18/2013 °Elsevier Interactive Patient Education ©2016 [employer] ° ° °

## 2018-07-14 NOTE — ED Triage Notes (Signed)
Patient is complaining of right flank pain that radiates to his right groin. Denies hematuria or fever. Pain started this morning. He reports he feels like he has to urinate but does not have a steady stream.

## 2018-07-14 NOTE — ED Notes (Signed)
Patient given discharge teaching and verbalized understanding. Patient ambulated out of ED with a steady gait. 

## 2018-07-14 NOTE — ED Provider Notes (Signed)
Emergency Department Provider Note   I have reviewed the triage vital signs and the nursing notes.   HISTORY  Chief Complaint Flank Pain   HPI Donald Wood is a 62 y.o. male with PMH of CAD, arthritis, HLD, and HTN presents to the emergency department for evaluation of severe right flank pain.  Symptoms began today.  He states the pain is in the right, lateral abdomen/flank area.  Denies any fevers or chills.  He has had similar pain with kidney stones in the past.  He states he has similar pain in June and was diagnosed with a large kidney stone at that time.  The pain resolved but he has not followed up with a urologist since that time.  He has never required any stenting or lithotripsy.  No vomiting or diarrhea.  No radiation of symptoms or other modifying factors.  Past Medical History:  Diagnosis Date  . Arthritis   . CAD (coronary artery disease), native coronary artery    07/29/15 Inferior infarct Cath showed normal left main, 30% diagonal, 60% distal LAD, 30% circumflex, occluded right coronary artery 4.0 x 38 mm and 4.0 x 16 mm Synergy drug-eluting stents to right coronary artery Dr. Sanjuana Kava   . Hyperglycemia   . Hyperlipidemia 07/31/2015  . Hypertension   . Obesity     Patient Active Problem List   Diagnosis Date Noted  . Sinus bradycardia 08/15/2015  . Bilateral carotid bruits 08/15/2015  . CAD (coronary artery disease), native coronary artery   . Hyperlipidemia   . ST elevation myocardial infarction (STEMI) of inferior wall (HCC) 07/29/2015  . Essential hypertension 07/29/2015  . Hyperglycemia 07/29/2015  . Obesity   . Osteoarthritis of left knee 12/17/2014    Past Surgical History:  Procedure Laterality Date  . CARDIAC CATHETERIZATION N/A 07/29/2015   Procedure: Left Heart Cath and Coronary Angiography;  Surgeon: Kathleene Hazel, MD;  [facility] CV [facility];  Service: Cardiovascular;  Laterality: N/A;  . CARDIAC CATHETERIZATION N/A 07/29/2015    Procedure: Coronary Stent Intervention;  Surgeon: Kathleene Hazel, MD;  Location: [facility] INVASIVE CV LAB;  Service: Cardiovascular;  Laterality: N/A;  . TOTAL KNEE ARTHROPLASTY Left 12/17/2014   Procedure: LEFT TOTAL KNEE ARTHROPLASTY;  Surgeon: Kathryne Hitch, MD;  Location: [facility] ORS;  Service: Orthopedics;  Laterality: Left;   Allergies Metoprolol  Family History  Problem Relation Age of Onset  . CAD Mother        Mom had heart problems in her 69s  . Heart attack Father   . Stroke Father   . Hypertension Father     Social History Social History   Tobacco Use  . Smoking status: Never Smoker  . Smokeless tobacco: Current User    Types: Snuff  Substance Use Topics  . Alcohol use: Yes    Alcohol/week: 0.0 standard drinks    Comment: 2-3 a month  . Drug use: No    Review of Systems  Constitutional: No fever/chills Eyes: No visual changes. ENT: No sore throat. Cardiovascular: Denies chest pain. Respiratory: Denies shortness of breath. Gastrointestinal: Positive right flank pain. No nausea, no vomiting.  No diarrhea.  No constipation. Genitourinary: Negative for dysuria. Musculoskeletal: Negative for back pain. Skin: Negative for rash. Neurological: Negative for headaches, focal weakness or numbness.  10-point ROS otherwise negative.  ____________________________________________   PHYSICAL EXAM:  VITAL SIGNS: ED Triage Vitals  Enc Vitals Group     BP 07/14/18 1300 (!) 142/72  Pulse Rate 07/14/18 1300 69     Resp 07/14/18 1300 15     Temp 07/14/18 1300 98.1 F (36.7 C)     Temp Source 07/14/18 1300 Oral     SpO2 07/14/18 1300 98 %     Weight 07/14/18 1313 242 lb (109.8 kg)     Height 07/14/18 1313 5\' 7"  (1.702 m)   Constitutional: Alert and oriented. Appears mildly uncomfortable.  Eyes: Conjunctivae are normal.  Head: Atraumatic. Nose: No congestion/rhinnorhea. Mouth/Throat: Mucous membranes are moist.  Oropharynx non-erythematous. Neck: No  stridor.  Cardiovascular: Normal rate, regular rhythm. Good peripheral circulation. Grossly normal heart sounds.   Respiratory: Normal respiratory effort.  No retractions. Lungs CTAB. Gastrointestinal: Soft and nontender. No distention.  Musculoskeletal: No lower extremity tenderness nor edema. No gross deformities of extremities. Neurologic:  Normal speech and language. No gross focal neurologic deficits are appreciated.  Skin:  Skin is warm, dry and intact. No rash noted.  ____________________________________________   LABS (all labs ordered are listed, but only abnormal results are displayed)  Labs Reviewed  URINALYSIS, ROUTINE W REFLEX MICROSCOPIC - Abnormal; Notable for the following components:      Result Value   Hgb urine dipstick SMALL (*)    All other components within normal limits  BASIC METABOLIC PANEL - Abnormal; Notable for the following components:   Glucose, Bld 104 (*)    BUN 26 (*)    Creatinine, Ser 1.29 (*)    GFR calc non Af Amer 58 (*)    All other components within normal limits  CBC WITH DIFFERENTIAL/PLATELET - Abnormal; Notable for the following components:   WBC 12.3 (*)    Neutro Abs 10.5 (*)    All other components within normal limits   ____________________________________________  RADIOLOGY  Ct Renal Stone Study  Result Date: 07/14/2018 CLINICAL DATA:  62 year old male with right flank pain extending to right groin. Started this morning. Prior kidney stones. Subsequent encounter. EXAM: CT ABDOMEN AND PELVIS WITHOUT CONTRAST TECHNIQUE: Multidetector CT imaging of the abdomen and pelvis was performed following the standard protocol without IV contrast. COMPARISON:  03/19/2018 CT. FINDINGS: Lower chest: No worrisome lung base abnormality. Prominent right coronary artery calcification. Calcification aortic valve. Heart size within normal limits. Hepatobiliary: Minimally lobulated contour. No other findings to suggest cirrhosis. Taking into account  limitation by non contrast imaging, no worrisome hepatic lesion. No calcified gallstones. Pancreas: Taking into account limitation by non contrast imaging, no worrisome pancreatic mass or inflammation. Spleen: Taking into account limitation by non contrast imaging, no worrisome splenic mass or enlargement. Adrenals/Urinary Tract: 6 x 5 x 10 mm distal right ureteral obstructing stone located 1 cm proximal to the right ureteral vesicle junction is causing marked right-sided hydroureteronephrosis. Tiny nonobstructing right renal calculi. No obstructing left renal stone. Taking into account limitation by non contrast imaging, no worrisome renal, urinary bladder or adrenal lesion. Tiny calcification medial limb right adrenal gland. Stomach/Bowel: Sigmoid diverticulosis. No extraluminal bowel inflammatory process. No inflammation surrounds the appendix. Vascular/Lymphatic: Atherosclerotic changes aorta and aortic branch vessels. No abdominal aortic aneurysm. Scattered small lymph nodes. Reproductive: Negative Other: No free air or bowel containing hernia. Musculoskeletal: Facet degenerative changes L4-5 with minimal anterior slip L4 mild L4-5 disc space narrowing. Mild disc space narrowing L2-3, L3-4 and L5-S1. IMPRESSION: 1. 6 x 5 x 10 mm distal right ureteral obstructing stone located 1 cm proximal to the right ureteral vesicle junction is causing marked right-sided hydroureteronephrosis. 2. Tiny nonobstructing right renal calculi. 3. Sigmoid  diverticulosis. 4.  Aortic Atherosclerosis (ICD10-I70.0). 5. Coronary artery calcifications. Electronically Signed   By: Lacy Duverney M.D.   On: 07/14/2018 17:36    ____________________________________________   PROCEDURES  Procedure(s) performed:   Procedures  None ____________________________________________   INITIAL IMPRESSION / ASSESSMENT AND PLAN / ED COURSE  Pertinent labs & imaging results that were available during my care of the patient were reviewed by  me and considered in my medical decision making (see chart for details).  Patient presents to the emergency department for evaluation of right flank pain.  Symptoms are most consistent with nephrolithiasis.  He has had symptoms in the past.  He did have a large (8mm) stone in the right lower ureter on 03/19/18.  Given continued symptoms on that side plan for repeat CT here along with pain management and labs. No concern clinically for infected stone.   6:04 PM Patient with kidney stone on the right with hydronephrosis.  No sign of infection on UA.  Kidney function near baseline and improved from June.  Appears to be a new stone on the right.  Patient is feeling much better after Toradol. Advised calling Urology in the AM. Discussed ED return precautions in detail.   At this time, I do not feel there is any life-threatening condition present. I have reviewed and discussed all results (EKG, imaging, lab, urine as appropriate), exam findings with patient. I have reviewed nursing notes and appropriate previous records.  I feel the patient is safe to be discharged home without further emergent workup. Discussed usual and customary return precautions. Patient and family (if present) verbalize understanding and are comfortable with this plan.  Patient will follow-up with their primary care provider. If they do not have a primary care provider, information for follow-up has been provided to them. All questions have been answered.  ____________________________________________  FINAL CLINICAL IMPRESSION(S) / ED DIAGNOSES  Final diagnoses:  Ureterolithiasis     MEDICATIONS GIVEN DURING THIS VISIT:  Medications  ketorolac (TORADOL) 30 MG/ML injection 30 mg (30 mg Intravenous Given 07/14/18 1642)  ondansetron (ZOFRAN) injection 4 mg (4 mg Intravenous Given 07/14/18 1643)     NEW OUTPATIENT MEDICATIONS STARTED DURING THIS VISIT:  New Prescriptions   IBUPROFEN (ADVIL,MOTRIN) 800 MG TABLET    Take 1  tablet (800 mg total) by mouth every 8 (eight) hours as needed for moderate pain.   ONDANSETRON (ZOFRAN ODT) 4 MG DISINTEGRATING TABLET    Take 1 tablet (4 mg total) by mouth every 8 (eight) hours as needed for nausea or vomiting.   OXYCODONE-ACETAMINOPHEN (PERCOCET/ROXICET) 5-325 MG TABLET    Take 1 tablet by mouth every 6 (six) hours as needed for severe pain.   TAMSULOSIN (FLOMAX) 0.4 MG CAPS CAPSULE    Take 1 capsule (0.4 mg total) by mouth daily for 14 days.    Note:  This document was prepared using Dragon voice recognition software and may include unintentional dictation errors.  Alona Bene, MD Emergency Medicine    Luther Newhouse, Arlyss Repress, MD 07/14/18 [phone]

## 2018-07-15 ENCOUNTER — Ambulatory Visit: Payer: 59 | Admitting: Physician Assistant

## 2018-12-31 IMAGING — CT CT RENAL STONE PROTOCOL
2 of 4 series · 16 of 46 positions shown, 18 images · non-contrast
Comparison: 03/19/2018 CT.

CLINICAL DATA: 61-year-old male with right flank pain extending to
right groin. Started this morning. Prior kidney stones. Subsequent
encounter.

EXAM:
CT ABDOMEN AND PELVIS WITHOUT CONTRAST
TECHNIQUE: Multidetector CT imaging of the abdomen and pelvis was performed
following the standard protocol without IV contrast.

[Series 2: axial st · axial · 0.80mm/px · z∈[+1077,+1497]mm · 13 of 96 slices shown, 15 images]
[im 6/96  soft-tissue]
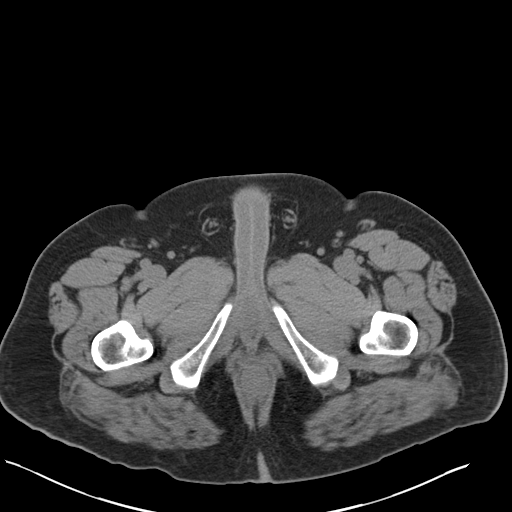
[im 6/96  bone]
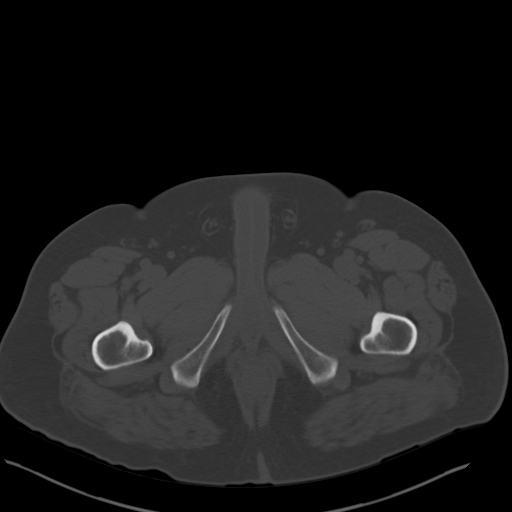
[im 11/96  soft-tissue]
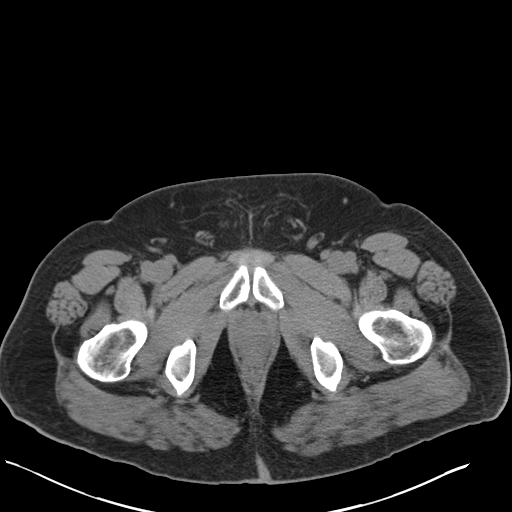
[im 22/96  soft-tissue]
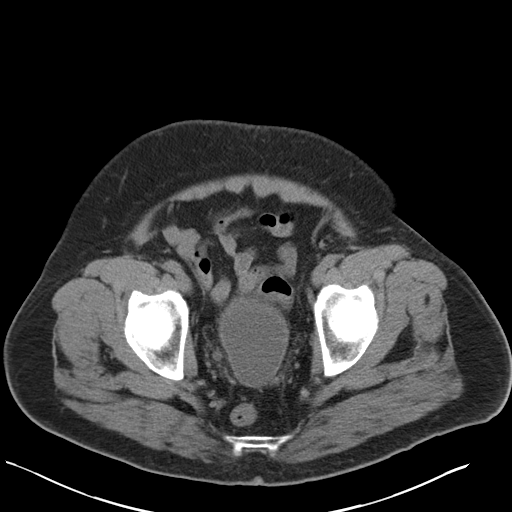
[im 27/96  soft-tissue]
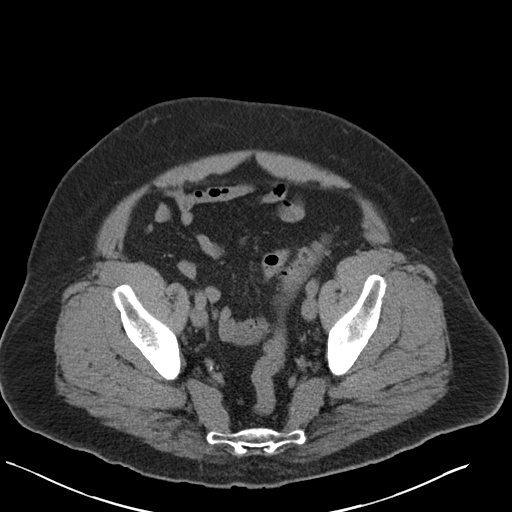
[im 32/96  soft-tissue]
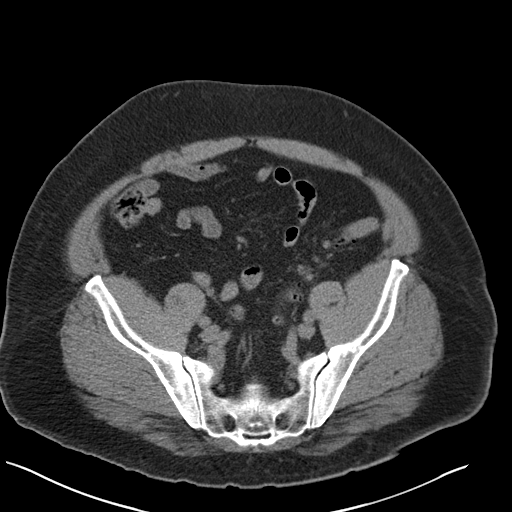
[im 43/96  soft-tissue]
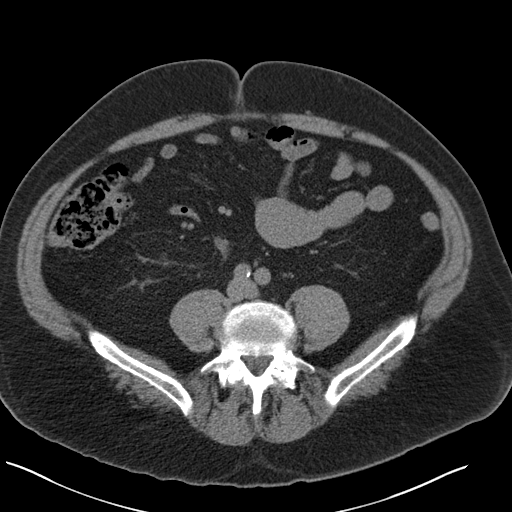
[im 48/96  soft-tissue]
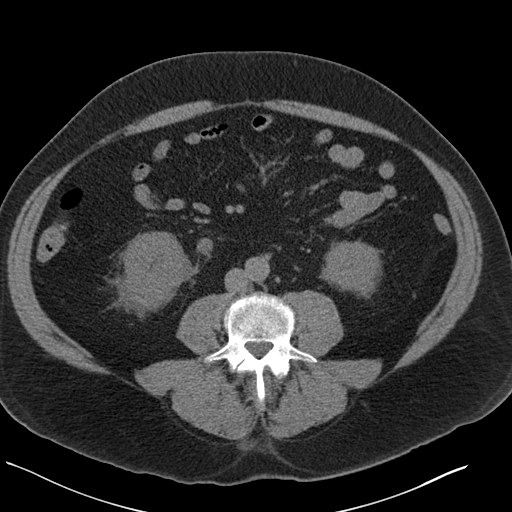
[im 53/96  soft-tissue]
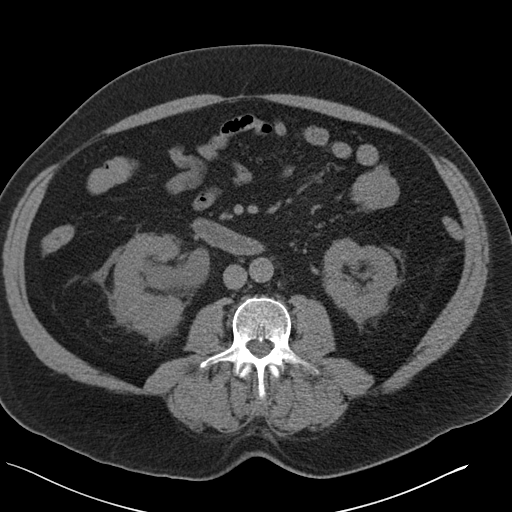
[im 64/96  soft-tissue]
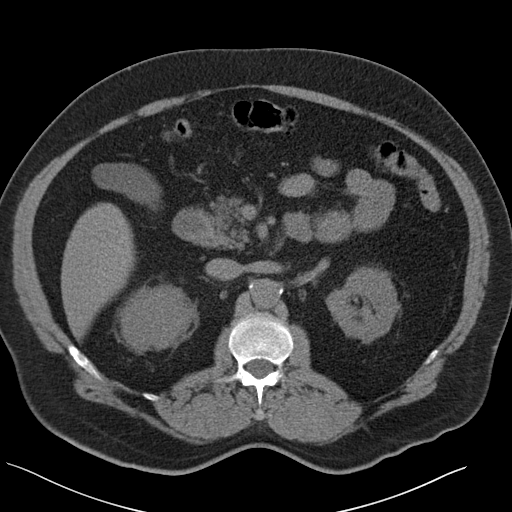
[im 64/96  bone]
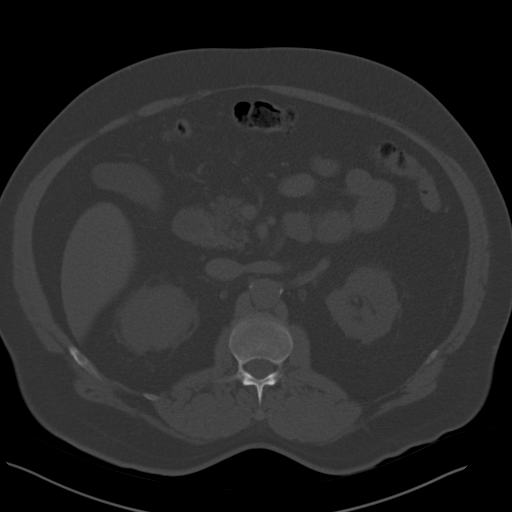
[im 69/96  soft-tissue]
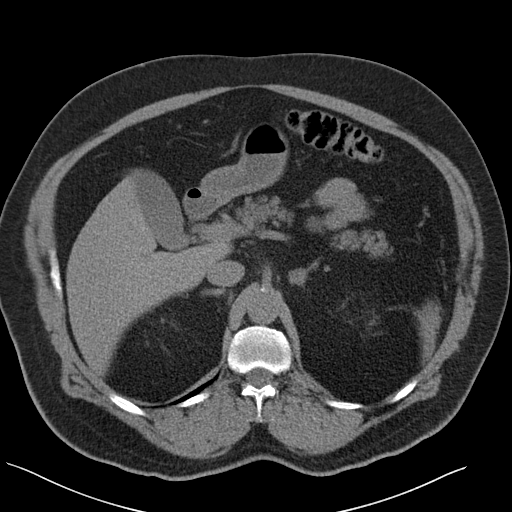
[im 74/96  soft-tissue]
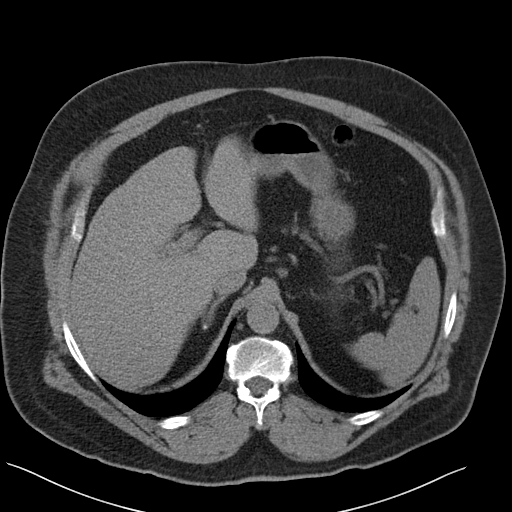
[im 85/96  soft-tissue]
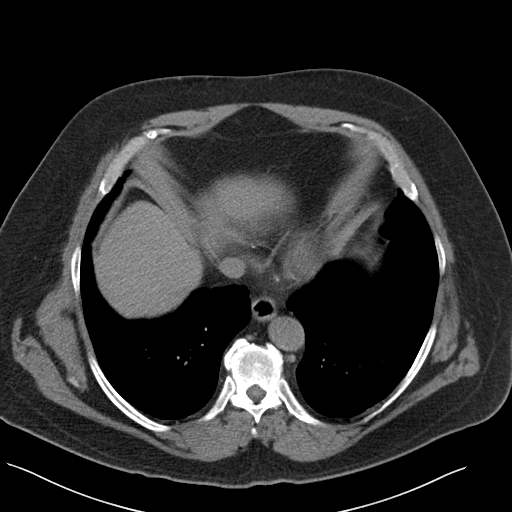
[im 90/96  soft-tissue]
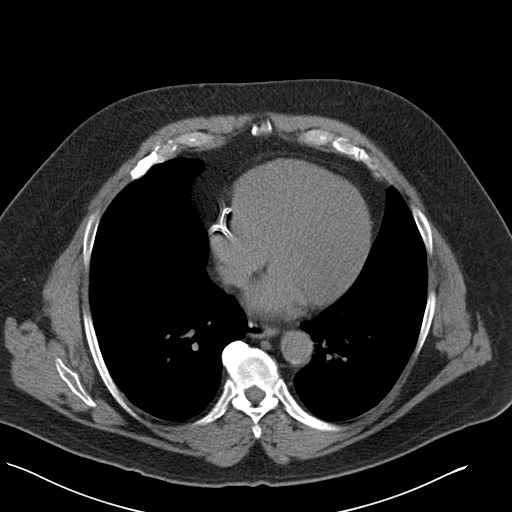

[Series 4: coronal · coronal · 0.80mm/px · 3 of 151 slices shown]
[im 51/151  soft-tissue]
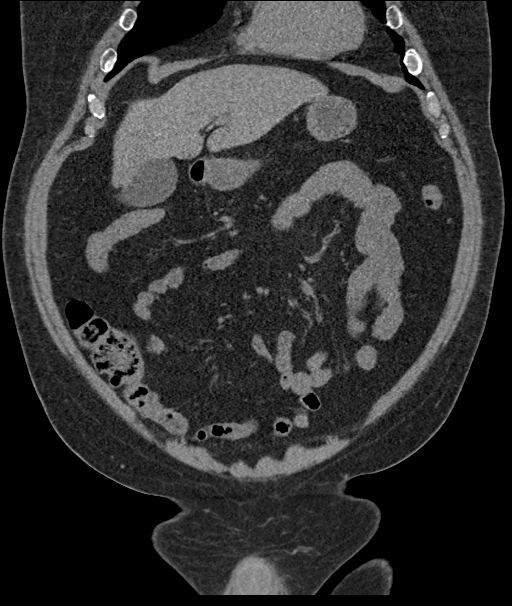
[im 67/151  soft-tissue]
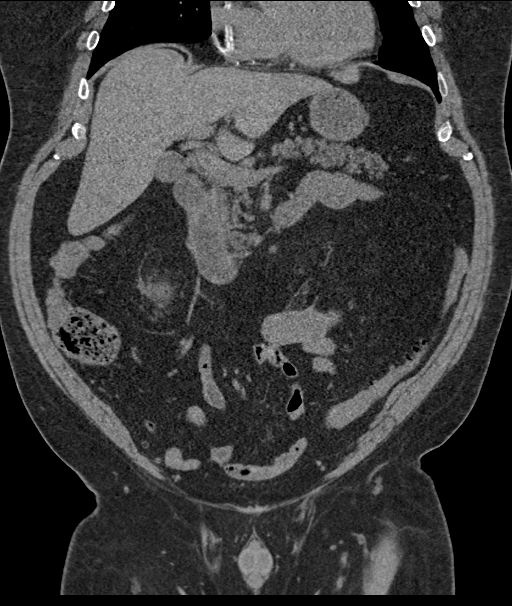
[im 84/151  soft-tissue]
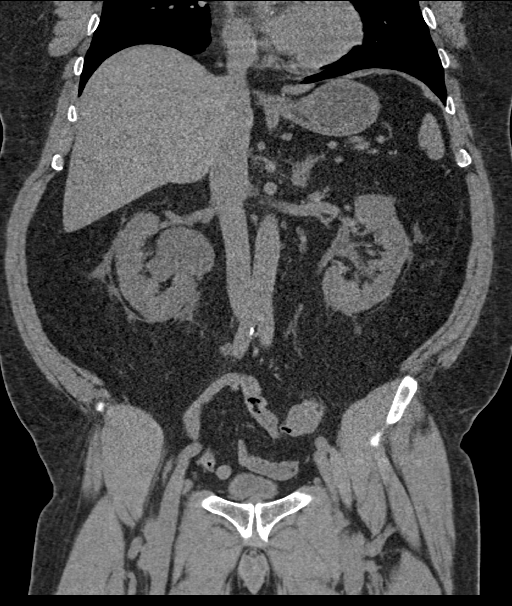

[16 of 46 positions shown; findings below may reference images not displayed]

FINDINGS: Lower chest: No worrisome lung base abnormality. Prominent right
coronary artery calcification. Calcification aortic valve. Heart
size within normal limits.

Hepatobiliary: Minimally lobulated contour. No other findings to
suggest cirrhosis. Taking into account limitation by non contrast
imaging, no worrisome hepatic lesion. No calcified gallstones.

Pancreas: Taking into account limitation by non contrast imaging, no
worrisome pancreatic mass or inflammation.

Spleen: Taking into account limitation by non contrast imaging, no
worrisome splenic mass or enlargement.

Adrenals/Urinary Tract: 6 x 5 x 10 mm distal right ureteral
obstructing stone located 1 cm proximal to the right ureteral
vesicle junction is causing marked right-sided
hydroureteronephrosis.

Tiny nonobstructing right renal calculi. No obstructing left renal
stone. Taking into account limitation by non contrast imaging, no
worrisome renal, urinary bladder or adrenal lesion. Tiny
calcification medial limb right adrenal gland.

Stomach/Bowel: Sigmoid diverticulosis. No extraluminal bowel
inflammatory process. No inflammation surrounds the appendix.

Vascular/Lymphatic: Atherosclerotic changes aorta and aortic branch
vessels. No abdominal aortic aneurysm. Scattered small lymph nodes.

Reproductive: Negative

Other: No free air or bowel containing hernia.

Musculoskeletal: Facet degenerative changes L4-5 with minimal
anterior slip L4 mild L4-5 disc space narrowing. Mild disc space
narrowing L2-3, L3-4 and L5-S1.
IMPRESSION: 1. 6 x 5 x 10 mm distal right ureteral obstructing stone located 1
cm proximal to the right ureteral vesicle junction is causing marked
right-sided hydroureteronephrosis.
2. Tiny nonobstructing right renal calculi.
3. Sigmoid diverticulosis.
4.  Aortic Atherosclerosis ([id number]).
5. Coronary artery calcifications.

## 2019-02-05 ENCOUNTER — Other Ambulatory Visit: Payer: Self-pay | Admitting: Cardiovascular Disease

## 2019-05-04 ENCOUNTER — Other Ambulatory Visit: Payer: Self-pay | Admitting: Cardiovascular Disease

## 2019-05-04 MED ORDER — AMLODIPINE BESYLATE 10 MG PO TABS: 10.0000 mg | ORAL_TABLET | Freq: Every day | ORAL | 0 refills | Status: DC

## 2019-05-04 MED ORDER — ATORVASTATIN CALCIUM 80 MG PO TABS: ORAL_TABLET | ORAL | 0 refills | Status: DC

## 2019-05-04 MED ORDER — CLOPIDOGREL BISULFATE 75 MG PO TABS: 75.0000 mg | ORAL_TABLET | Freq: Every day | ORAL | 0 refills | Status: DC

## 2019-05-20 ENCOUNTER — Other Ambulatory Visit: Payer: Self-pay | Admitting: Cardiovascular Disease

## 2019-05-25 ENCOUNTER — Other Ambulatory Visit: Payer: Self-pay | Admitting: Cardiovascular Disease

## 2019-05-25 MED ORDER — ATORVASTATIN CALCIUM 80 MG PO TABS: ORAL_TABLET | ORAL | 0 refills | Status: DC

## 2019-05-25 MED ORDER — LISINOPRIL 40 MG PO TABS: ORAL_TABLET | ORAL | 0 refills | Status: DC

## 2019-05-25 MED ORDER — AMLODIPINE BESYLATE 10 MG PO TABS: 10.0000 mg | ORAL_TABLET | Freq: Every day | ORAL | 0 refills | Status: DC

## 2019-05-25 MED ORDER — NITROGLYCERIN 0.4 MG SL SUBL: 0.4000 mg | SUBLINGUAL_TABLET | SUBLINGUAL | 0 refills | Status: DC | PRN

## 2019-05-25 MED ORDER — CLOPIDOGREL BISULFATE 75 MG PO TABS: 75.0000 mg | ORAL_TABLET | Freq: Every day | ORAL | 0 refills | Status: DC

## 2019-05-25 MED ORDER — HYDROCHLOROTHIAZIDE 25 MG PO TABS: ORAL_TABLET | ORAL | 0 refills | Status: DC

## 2019-05-25 NOTE — Telephone Encounter (Signed)
Pt's medications were sent to pt's pharmacy as requested. Confirmation received.  

## 2019-05-25 NOTE — Telephone Encounter (Signed)
° ° ° °*  STAT* If patient is at the pharmacy, call can be transferred to refill team.   1. Which medications need to be refilled? (please list name of each medication and dose if known)  amLODipine (NORVASC) 10 MG tablet atorvastatin (LIPITOR) 80 MG tablet clopidogrel (PLAVIX) 75 MG tablet hydrochlorothiazide (HYDRODIURIL) 25 MG tablet lisinopril (ZESTRIL) 40 MG tablet nitroGLYCERIN (NITROSTAT) 0.4 MG SL tablet     2. Which pharmacy/location (including street and city if local pharmacy) is medication to be sent to? [id number] - [place], [place] - [address]  3. Do they need a 30 day or 90 day supply? [place]

## 2019-06-23 ENCOUNTER — Encounter: Payer: Self-pay | Admitting: Physician Assistant

## 2019-06-23 NOTE — Progress Notes (Signed)
Cardiology Office Note    Date:  06/25/2019   ID:  TADEH FACEMIRE, DOB 1956/09/07, MRN [id number]  PCP:  Patient, No Pcp Per  Cardiologist:  [name], MD  Electrophysiologist:  None   Chief Complaint: 12 month f/u CAD  History of Present Illness:   Donald Wood is a 63 y.o. male with history of CAD (inferior STEMI s/p DESx2 to mRCA), HTN, HLD, carotid artery disease, possible right subclavian artery disease, ? chronic renal insufficiency, obesity here today for cardiac follow up. He was admitted to [facility] in October 2016 with an inferior STEMI. His mid RCA was occluded. 2 drug eluting stents were placed in the mid RCA. There was moderate non-obstructive disease in the LAD and Ramus intermediate branch. 2D echo 07/2015 showed moderate LVH, EF 55-60%, no RWMA, normal LA size, could not assess diastolic function. Metoprolol was stopped due to bradycardia. Carotid duplex 08/2017 showed mild 1-39% stenosis, and disturbed right subclavian artery flow. Last labs 07/2018 showed Hgb 13.3, Plt 267, WBC 12.3 (appears elevated peristently), K 4.5, Cr 1.29 (Cr 1.2-1.6 in 03/2018), 12/2017 LDL 61.  He returns for follow-up overall feeling well. His family has noticed a subtle dyspnea on exertion over the last 1 year but isn't sure if it's perhaps due to less activity during the pandemic. He has not had any recurrent angina like what he had with his prior MI. He says he personally did not notice an issue but his wife pointed it out. He denies any symptoms of subclavian steal. BP's bilaterally were near-equal at 122 and 126 systolic checked personally by me. He denies any bleeding. No SOB at rest, no orthopnea, LEE, PND, significant weight gain.    Past Medical History:  Diagnosis Date   Arthritis    CAD (coronary artery disease), native coronary artery    a.07/29/15 Inferior infarct Cath showed normal left main, 30% diagonal, 60% distal LAD, 30% circumflex, occluded right coronary artery 4.0 x 38  mm and 4.0 x 16 mm Synergy drug-eluting stents to right coronary artery.   Hyperglycemia    Hyperlipidemia 07/31/2015   Hypertension    Leukocytosis    noted on labs   Mild carotid artery disease (HCC)    Obesity    Renal insufficiency    Subclavian artery stenosis (HCC)    a. ? right flow disturbed on carotid duplex 2018.    Past Surgical History:  Procedure Laterality Date   CARDIAC CATHETERIZATION N/A 07/29/2015   Procedure: Left Heart Cath and Coronary Angiography;  Surgeon: Kathleene Hazel, MD;  Location[facility] INVASIVE CV LAB;  Service: Cardiovascular;  Laterality: N/A;   CARDIAC CATHETERIZATION N/A 07/29/2015   Procedure: Coronary Stent Intervention;  Surgeon: Kathleene Hazel, MD;  Location: [facility] INVASIVE CV LAB;  Service: Cardiovascular;  Laterality: N/A;   TOTAL KNEE ARTHROPLASTY Left 12/17/2014   Procedure: LEFT TOTAL KNEE ARTHROPLASTY;  Surgeon: Kathryne Hitch, MD;  Location: [facility] ORS;  Service: Orthopedics;  Laterality: Left;    Current Medications: Current Meds  Medication Sig   amLODipine (NORVASC) 10 MG tablet Take 1 tablet (10 mg total) by mouth daily. Please keep upcoming appt in September for future refills. Thank you   atorvastatin (LIPITOR) 80 MG tablet TAKE 1 TABLET BY MOUTH EVERY DAY AT 6 PM.   clopidogrel (PLAVIX) 75 MG tablet Take 1 tablet (75 mg total) by mouth daily.   hydrochlorothiazide (HYDRODIURIL) 25 MG tablet TAKE 1 TABLET(25 MG) BY MOUTH DAILY.   ibuprofen (ADVIL,MOTRIN)  800 MG tablet Take 1 tablet (800 mg total) by mouth every 8 (eight) hours as needed for moderate pain.   lisinopril (ZESTRIL) 40 MG tablet TAKE 1 TABLET(40 MG) BY MOUTH DAILY   nitroGLYCERIN (NITROSTAT) 0.4 MG SL tablet Place 1 tablet (0.4 mg total) under the tongue every 5 (five) minutes as needed for chest pain. Please keep upcoming appt in September for future refills. Thank you   ondansetron (ZOFRAN ODT) 4 MG disintegrating tablet Take 1 tablet (4  mg total) by mouth every 8 (eight) hours as needed for nausea or vomiting.   oxyCODONE-acetaminophen (PERCOCET/ROXICET) 5-325 MG tablet Take 1 tablet by mouth every 6 (six) hours as needed for severe pain.   triamcinolone cream (KENALOG) 0.1 % Apply 1 application topically 3 (three) times daily.   [DISCONTINUED] atorvastatin (LIPITOR) 80 MG tablet TAKE 1 TABLET BY MOUTH EVERY DAY AT 6 PM.Please keep upcoming appt in September for future refills. Thank you   [DISCONTINUED] clopidogrel (PLAVIX) 75 MG tablet Take 1 tablet (75 mg total) by mouth daily. Please keep upcoming appt in September for future refills. Thank you   [DISCONTINUED] hydrochlorothiazide (HYDRODIURIL) 25 MG tablet TAKE 1 TABLET(25 MG) BY MOUTH DAILY. Please keep upcoming appt in September for future refills. Thank you   [DISCONTINUED] lisinopril (ZESTRIL) 40 MG tablet TAKE 1 TABLET(40 MG) BY MOUTH DAILY. Please keep upcoming appt in September for future refills. Thank you   [DISCONTINUED] nitroGLYCERIN (NITROSTAT) 0.4 MG SL tablet Place 1 tablet (0.4 mg total) under the tongue every 5 (five) minutes as needed for chest pain. Please keep upcoming appt in September for future refills. Thank you      Allergies:   Metoprolol   Social History   Socioeconomic History   Marital status: Married    Spouse name: Not on file   Number of children: 2   Years of education: Not on file   Highest education level: Not on file  Occupational History   Not on file  Social Needs   Financial resource strain: Not on file   Food insecurity    Worry: Not on file    Inability: Not on file   Transportation needs    Medical: Not on file    Non-medical: Not on file  Tobacco Use   Smoking status: Never Smoker   Smokeless tobacco: Current User    Types: Snuff  Substance and Sexual Activity   Alcohol use: Yes    Alcohol/week: 0.0 standard drinks    Comment: 2-3 a month   Drug use: No   Sexual activity: Yes    Partners:  Female  Lifestyle   Physical activity    Days per week: Not on file    Minutes per session: Not on file   Stress: Not on file  Relationships   Social connections    Talks on phone: Not on file    Gets together: Not on file    Attends religious service: Not on file    Active member of club or organization: Not on file    Attends meetings of clubs or organizations: Not on file    Relationship status: Not on file  Other Topics Concern   Not on file  Social History Narrative   Not on file     Family History:  The patient's family history includes CAD in his mother; Heart attack in his father; Hypertension in his father; Stroke in his father.  ROS:   Please see the history of present illness.  All other systems are reviewed and otherwise negative.    EKGs/Labs/Other Studies Reviewed:    Studies reviewed were summarized above.   EKG:  EKG is ordered today, personally reviewed, demonstrating NSR 68bpm prior inferior infarct, no acute changes  Recent Labs: 07/14/2018: BUN 26; Creatinine, Ser 1.29; Hemoglobin 13.3; Platelets 267; Potassium 4.5; Sodium 140  Recent Lipid Panel    Component Value Date/Time   CHOL 118 01/16/2018 0947   TRIG 98 01/16/2018 0947   HDL 37 (L) 01/16/2018 0947   CHOLHDL 3.2 01/16/2018 0947   CHOLHDL 2.4 09/16/2015 0858   VLDL 11 09/16/2015 0858   LDLCALC 61 01/16/2018 0947    PHYSICAL EXAM:    VS:  BP 126/80    Pulse 68    Ht 5\' 8"  (1.727 m)    Wt 244 lb (110.7 kg)    SpO2 98%    BMI 37.10 kg/m   BMI: Body mass index is 37.1 kg/m.  GEN: Well nourished, well developed WM in no acute distress HEENT: normocephalic, atraumatic Neck: no JVD, carotid bruits, or masses. No subclavian bruits Cardiac: RRR; no murmurs, rubs, or gallops, no edema  Respiratory:  clear to auscultation bilaterally, normal work of breathing GI: soft, nontender, nondistended, + BS MS: no deformity or atrophy Skin: warm and dry, no rash Neuro:  Alert and Oriented x 3,  Strength and sensation are intact, follows commands Psych: euthymic mood, full affect  Wt Readings from Last 3 Encounters:  06/25/19 244 lb (110.7 kg)  07/14/18 242 lb (109.8 kg)  03/19/18 246 lb 9.6 oz (111.9 kg)     ASSESSMENT & PLAN:   1. CAD - clinically stable and EKG unchanged, but there has ben some dyspnea. He is not sure if it's due to decreased activity. Will arrange Lexiscan nuclear stress test. Risks/benefits discussed with patient who is agreeable. He will continue present regimen otherwise. He is not on ASA due to easy bruising but has done well on longterm Plavix. He is not on BB due to bradycardia. Continue statin. Check basic labs to r/o other cause for DOE. 2. Essential HTN - controlled on present regimen. Update yearly labs. 3. Hyperlipidemia - he ate a salad for lunch about 4 hours ago. His prior LDL was well controlled. Will get lipid profile with his labs today. If it is above goal, will plan to repeat fasting when he comes in for his nuc. 4. Carotid artery disease and possible right subclavian stenosis - no significant bruits or symptoms of steal. He is due for updated surveillance duplex, will arrange.  Disposition: F/u with Dr. Angelena Form in 1 year if nuc is normal.  Medication Adjustments/Labs and Tests Ordered: Current medicines are reviewed at length with the patient today.  Concerns regarding medicines are outlined above. Medication changes, Labs and Tests ordered today are summarized above and listed in the Patient Instructions accessible in Encounters.   Signed, Charlie Pitter, PA-C  06/25/2019 5:09 PM    [facility] [address], [place], [place]  [address] Phone: [phone]; Fax: [phone]

## 2019-06-25 ENCOUNTER — Encounter: Payer: Self-pay | Admitting: Physician Assistant

## 2019-06-25 ENCOUNTER — Encounter: Payer: Self-pay | Admitting: *Deleted

## 2019-06-25 ENCOUNTER — Other Ambulatory Visit: Payer: Self-pay

## 2019-06-25 ENCOUNTER — Ambulatory Visit (INDEPENDENT_AMBULATORY_CARE_PROVIDER_SITE_OTHER): Payer: 59 | Admitting: Physician Assistant

## 2019-06-25 ENCOUNTER — Telehealth (HOSPITAL_COMMUNITY): Payer: Self-pay | Admitting: Rehabilitation

## 2019-06-25 VITALS — BP 126/80 | HR 68 | Ht 68.0 in | Wt 244.0 lb

## 2019-06-25 DIAGNOSIS — I2510 Atherosclerotic heart disease of native coronary artery without angina pectoris: Secondary | ICD-10-CM

## 2019-06-25 DIAGNOSIS — I6523 Occlusion and stenosis of bilateral carotid arteries: Secondary | ICD-10-CM

## 2019-06-25 DIAGNOSIS — I10 Essential (primary) hypertension: Secondary | ICD-10-CM | POA: Diagnosis not present

## 2019-06-25 DIAGNOSIS — I771 Stricture of artery: Secondary | ICD-10-CM

## 2019-06-25 DIAGNOSIS — E785 Hyperlipidemia, unspecified: Secondary | ICD-10-CM

## 2019-06-25 DIAGNOSIS — R0602 Shortness of breath: Secondary | ICD-10-CM

## 2019-06-25 DIAGNOSIS — R0609 Other forms of dyspnea: Secondary | ICD-10-CM | POA: Diagnosis not present

## 2019-06-25 MED ORDER — HYDROCHLOROTHIAZIDE 25 MG PO TABS: ORAL_TABLET | ORAL | 3 refills | Status: DC

## 2019-06-25 MED ORDER — CLOPIDOGREL BISULFATE 75 MG PO TABS: 75.0000 mg | ORAL_TABLET | Freq: Every day | ORAL | 3 refills | Status: DC

## 2019-06-25 MED ORDER — ATORVASTATIN CALCIUM 80 MG PO TABS: ORAL_TABLET | ORAL | 3 refills | Status: DC

## 2019-06-25 MED ORDER — LISINOPRIL 40 MG PO TABS: ORAL_TABLET | ORAL | 3 refills | Status: DC

## 2019-06-25 MED ORDER — NITROGLYCERIN 0.4 MG SL SUBL: 0.4000 mg | SUBLINGUAL_TABLET | SUBLINGUAL | 3 refills | Status: DC | PRN

## 2019-06-25 NOTE — Patient Instructions (Signed)
Medication Instructions:  Your physician recommends that you continue on your current medications as directed. Please refer to the Current Medication list given to you today.  If you need a refill on your cardiac medications before your next appointment, please call your pharmacy.   Lab work: TODAY:  CMET, CBC, TSH, & LIPID  If you have labs (blood work) drawn today and your tests are completely normal, you will receive your results only by: Marland Kitchen MyChart Message (if you have MyChart) OR . A paper copy in the mail If you have any lab test that is abnormal or we need to change your treatment, we will call you to review the results.  Testing/Procedures: Your physician has requested that you have a carotid duplex. This test is an ultrasound of the carotid arteries in your neck. It looks at blood flow through these arteries that supply the brain with blood. Allow one hour for this exam. There are no restrictions or special instructions.   Your physician has requested that you have a lexiscan myoview. For further information please visit [email or web address] Please follow instruction sheet, as given.    Follow-Up: At [facility], you and your health needs are our priority.  As part of our continuing mission to provide you with exceptional heart care, we have created designated Provider Care Teams.  These Care Teams include your primary Cardiologist (physician) and Advanced Practice Providers (APPs -  Physician Assistants and Nurse Practitioners) who all work together to provide you with the care you need, when you need it. You will need a follow up appointment in 12 months.  Please call our office 2 months in advance to schedule this appointment.  You may see [name], MD or one of the following Advanced Practice Providers on your designated Care Team:   [place], PA-C Ronie Spies, PA-C . Jacolyn Reedy, PA-C  Any Other Special Instructions Will Be Listed Below (If  Applicable). Regadenoson injection What is this medicine? REGADENOSON is used to test the heart for coronary artery disease. It is used in patients who can not exercise for their stress test. This medicine may be used for other purposes; ask your health care provider or pharmacist if you have questions. COMMON BRAND NAME(S): Lexiscan What should I tell my health care provider before I take this medicine? They need to know if you have any of these conditions:  heart problems  lung or breathing disease, like asthma or COPD  an unusual or allergic reaction to regadenoson, other medicines, foods, dyes, or preservatives  pregnant or trying to get pregnant  breast-feeding How should I use this medicine? This medicine is for injection into a vein. It is given by a health care professional in a hospital or clinic setting. Talk to your pediatrician regarding the use of this medicine in children. Special care may be needed. Overdosage: If you think you have taken too much of this medicine contact a poison control center or emergency room at once. NOTE: This medicine is only for you. Do not share this medicine with others. What if I miss a dose? This does not apply. What may interact with this medicine?  caffeine  dipyridamole  guarana  theophylline This list may not describe all possible interactions. Give your health care provider a list of all the medicines, herbs, non-prescription drugs, or dietary supplements you use. Also tell them if you smoke, drink alcohol, or use illegal drugs. Some items may interact with your medicine. What should I watch for while  using this medicine? Your condition will be monitored carefully while you are receiving this medicine. Do not take medicines, foods, or drinks with caffeine (like coffee, tea, or colas) for at least 12 hours before your test. If you do not know if something contains caffeine, ask your health care professional. What side effects may I  notice from receiving this medicine? Side effects that you should report to your doctor or health care professional as soon as possible:  allergic reactions like skin rash, itching or hives, swelling of the face, lips, or tongue  breathing problems  chest pain, tightness or palpitations  severe headache Side effects that usually do not require medical attention (report to your doctor or health care professional if they continue or are bothersome):  flushing  headache  irritation or pain at site where injected  nausea, vomiting This list may not describe all possible side effects. Call your doctor for medical advice about side effects. You may report side effects to FDA at 1-800-FDA-1088. Where should I keep my medicine? This drug is given in a hospital or clinic and will not be stored at home. NOTE: This sheet is a summary. It may not cover all possible information. If you have questions about this medicine, talk to your doctor, pharmacist, or health care provider.  2020 Elsevier/Gold Standard (2008-05-17 15:08:13)

## 2019-06-26 ENCOUNTER — Other Ambulatory Visit: Payer: Self-pay | Admitting: Cardiovascular Disease

## 2019-06-26 ENCOUNTER — Ambulatory Visit: Payer: 59 | Admitting: Physician Assistant

## 2019-06-26 LAB — COMPREHENSIVE METABOLIC PANEL
ALT: 52 IU/L — ABNORMAL HIGH (ref 0–44)
AST: 47 IU/L — ABNORMAL HIGH (ref 0–40)
Albumin/Globulin Ratio: 1.4 (ref 1.2–2.2)
Albumin: 4.6 g/dL (ref 3.8–4.8)
Alkaline Phosphatase: 89 IU/L (ref 39–117)
BUN/Creatinine Ratio: 20 (ref 10–24)
BUN: 29 mg/dL — ABNORMAL HIGH (ref 8–27)
Bilirubin Total: 0.8 mg/dL (ref 0.0–1.2)
CO2: 21 mmol/L (ref 20–29)
Calcium: 10.6 mg/dL — ABNORMAL HIGH (ref 8.6–10.2)
Chloride: 106 mmol/L (ref 96–106)
Creatinine, Ser: 1.43 mg/dL — ABNORMAL HIGH (ref 0.76–1.27)
GFR calc Af Amer: 60 mL/min/{1.73_m2} (ref >59)
GFR calc non Af Amer: 52 mL/min/{1.73_m2} — ABNORMAL LOW (ref >59)
Globulin, Total: 3.3 g/dL (ref 1.5–4.5)
Glucose: 91 mg/dL (ref 65–99)
Potassium: 5 mmol/L (ref 3.5–5.2)
Sodium: 140 mmol/L (ref 134–144)
Total Protein: 7.9 g/dL (ref 6.0–8.5)

## 2019-06-26 LAB — CBC
Hematocrit: 41 % (ref 37.5–51.0)
Hemoglobin: 14 g/dL (ref 13.0–17.7)
MCH: 31.5 pg (ref 26.6–33.0)
MCHC: 34.1 g/dL (ref 31.5–35.7)
MCV: 92 fL (ref 79–97)
Platelets: 294 10*3/uL (ref 150–450)
RBC: 4.45 x10E6/uL (ref 4.14–5.80)
RDW: 12.6 % (ref 11.6–15.4)
WBC: 9.7 10*3/uL (ref 3.4–10.8)

## 2019-06-26 LAB — TSH: TSH: 1.32 u[IU]/mL (ref 0.450–4.500)

## 2019-06-26 LAB — LIPID PANEL
Chol/HDL Ratio: 3.2 ratio (ref 0.0–5.0)
Cholesterol, Total: 119 mg/dL (ref 100–199)
HDL: 37 mg/dL — ABNORMAL LOW (ref >39)
LDL Chol Calc (NIH): 66 mg/dL (ref 0–99)
Triglycerides: 77 mg/dL (ref 0–149)
VLDL Cholesterol Cal: 16 mg/dL (ref 5–40)

## 2019-06-26 MED ORDER — AMLODIPINE BESYLATE 10 MG PO TABS: 10.0000 mg | ORAL_TABLET | Freq: Every day | ORAL | 3 refills | Status: DC

## 2019-06-26 NOTE — Telephone Encounter (Signed)
Pt's medication was sent to pt's pharmacy as requested. Confirmation received.  °

## 2019-06-30 ENCOUNTER — Encounter (HOSPITAL_COMMUNITY): Payer: 59

## 2019-07-03 ENCOUNTER — Other Ambulatory Visit: Payer: Self-pay

## 2019-07-03 ENCOUNTER — Encounter: Payer: Self-pay | Admitting: Physician Assistant

## 2019-07-03 ENCOUNTER — Ambulatory Visit (INDEPENDENT_AMBULATORY_CARE_PROVIDER_SITE_OTHER): Payer: 59 | Admitting: Physician Assistant

## 2019-07-03 VITALS — BP 112/76 | HR 58 | Temp 98.4°F | Resp 16 | Ht 66.5 in | Wt 240.0 lb

## 2019-07-03 DIAGNOSIS — R7989 Other specified abnormal findings of blood chemistry: Secondary | ICD-10-CM

## 2019-07-03 DIAGNOSIS — Z23 Encounter for immunization: Secondary | ICD-10-CM

## 2019-07-03 DIAGNOSIS — I2510 Atherosclerotic heart disease of native coronary artery without angina pectoris: Secondary | ICD-10-CM

## 2019-07-03 DIAGNOSIS — E7800 Pure hypercholesterolemia, unspecified: Secondary | ICD-10-CM | POA: Diagnosis not present

## 2019-07-03 DIAGNOSIS — Z1211 Encounter for screening for malignant neoplasm of colon: Secondary | ICD-10-CM

## 2019-07-03 DIAGNOSIS — R748 Abnormal levels of other serum enzymes: Secondary | ICD-10-CM | POA: Diagnosis not present

## 2019-07-03 DIAGNOSIS — I10 Essential (primary) hypertension: Secondary | ICD-10-CM | POA: Diagnosis not present

## 2019-07-03 DIAGNOSIS — Z125 Encounter for screening for malignant neoplasm of prostate: Secondary | ICD-10-CM

## 2019-07-03 LAB — COMPREHENSIVE METABOLIC PANEL
ALT: 54 U/L — ABNORMAL HIGH (ref 0–53)
AST: 39 U/L — ABNORMAL HIGH (ref 0–37)
Albumin: 4.5 g/dL (ref 3.5–5.2)
Alkaline Phosphatase: 74 U/L (ref 39–117)
BUN: 25 mg/dL — ABNORMAL HIGH (ref 6–23)
CO2: 24 mEq/L (ref 19–32)
Calcium: 10.1 mg/dL (ref 8.4–10.5)
Chloride: 105 mEq/L (ref 96–112)
Creatinine, Ser: 1.16 mg/dL (ref 0.40–1.50)
GFR: 63.64 mL/min (ref >60.00)
Glucose, Bld: 102 mg/dL — ABNORMAL HIGH (ref 70–99)
Potassium: 4.8 mEq/L (ref 3.5–5.1)
Sodium: 139 mEq/L (ref 135–145)
Total Bilirubin: 1.1 mg/dL (ref 0.2–1.2)
Total Protein: 7.7 g/dL (ref 6.0–8.3)

## 2019-07-03 LAB — PSA: PSA: 0.49 ng/mL (ref 0.10–4.00)

## 2019-07-03 LAB — VITAMIN D 25 HYDROXY (VIT D DEFICIENCY, FRACTURES): VITD: 42.38 ng/mL (ref 30.00–100.00)

## 2019-07-03 NOTE — Progress Notes (Signed)
Patient presents to clinic today to establish care at the request of his Cardiology group. Patient with history of CAD, HTN, HLD with prior STEMI, currently on a regimen of amlodipine 10 mg QD, Lisinopril 40 mg QD, HCTZ 25 mg QD, Atorvastatin 80 mg QD and Pavix 75 mg QD. Endorses taking medications as directed. Patient denies chest pain, palpitations, lightheadedness, dizziness, vision changes or frequent headaches.  On recent labs with specialist he was noted again to have elevated creatinine (1.43 at last count) and mild elevation in LFTs which seems to be ongoing issue without prior full evaluation. Has gotten down from 300 lbs to 240. Stays well-hydrated.   Health Maintenance: Immunizations -- Agrees to flu shot today. Colon Cancer Screening -- Overdue. Would like to know options. .   Past Medical History:  Diagnosis Date  . Arthritis   . CAD (coronary artery disease), native coronary artery    a.07/29/15 Inferior infarct Cath showed normal left main, 30% diagonal, 60% distal LAD, 30% circumflex, occluded right coronary artery 4.0 x 38 mm and 4.0 x 16 mm Synergy drug-eluting stents to right coronary artery.  . Hyperglycemia   . Hyperlipidemia 07/31/2015  . Hypertension   . Leukocytosis    noted on labs  . Mild carotid artery disease ([place])   . Obesity   . Renal insufficiency   . Subclavian artery stenosis ([facility])    a. ? right flow disturbed on carotid duplex 2018.    Past Surgical History:  Procedure Laterality Date  . CARDIAC CATHETERIZATION N/A 07/29/2015   Procedure: Left Heart Cath and Coronary Angiography;  Surgeon: Burnell Blanks, MD;  Location: [facility];  Service: Cardiovascular;  Laterality: N/A;  . CARDIAC CATHETERIZATION N/A 07/29/2015   Procedure: Coronary Stent Intervention;  Surgeon: Burnell Blanks, MD;  Location: [facility];  Service: Cardiovascular;  Laterality: N/A;  . TOTAL KNEE ARTHROPLASTY Left 12/17/2014   Procedure: LEFT  TOTAL KNEE ARTHROPLASTY;  Surgeon: Mcarthur Rossetti, MD;  Location: [facility];  Service: Orthopedics;  Laterality: Left;    Current Outpatient Medications on File Prior to Visit  Medication Sig Dispense Refill  . amLODipine (NORVASC) 10 MG tablet Take 1 tablet (10 mg total) by mouth daily. 90 tablet 3  . atorvastatin (LIPITOR) 80 MG tablet TAKE 1 TABLET BY MOUTH EVERY DAY AT 6 PM. 90 tablet 3  . clopidogrel (PLAVIX) 75 MG tablet Take 1 tablet (75 mg total) by mouth daily. 90 tablet 3  . hydrochlorothiazide (HYDRODIURIL) 25 MG tablet TAKE 1 TABLET(25 MG) BY MOUTH DAILY. 90 tablet 3  . lisinopril (ZESTRIL) 40 MG tablet TAKE 1 TABLET(40 MG) BY MOUTH DAILY 90 tablet 3  . nitroGLYCERIN (NITROSTAT) 0.4 MG SL tablet Place 1 tablet (0.4 mg total) under the tongue every 5 (five) minutes as needed for chest pain. Please keep upcoming appt in September for future refills. Thank you 25 tablet 3   No current facility-administered medications on file prior to visit.     Allergies  Allergen Reactions  . Metoprolol Other (See Comments)    Pt trialed on metoprolol and carvedilol - bradycardic with HR 40-50 on both beta blockers.    Family History  Problem Relation Age of Onset  . CAD Mother        Mom had heart problems in her 66s  . Heart attack Father   . Stroke Father   . Hypertension Father     Social History   Socioeconomic History  .  Marital status: Married    Spouse name: Not on file  . Number of children: 2  . Years of education: Not on file  . Highest education level: Not on file  Occupational History  . Not on file  Social Needs  . Financial resource strain: Not on file  . Food insecurity    Worry: Not on file    Inability: Not on file  . Transportation needs    Medical: Not on file    Non-medical: Not on file  Tobacco Use  . Smoking status: Never Smoker  . Smokeless tobacco: Current User    Types: Snuff  Substance and Sexual Activity  . Alcohol use: Yes     Alcohol/week: 0.0 standard drinks    Comment: 2-3 a month  . Drug use: No  . Sexual activity: Yes    Partners: Female  Lifestyle  . Physical activity    Days per week: Not on file    Minutes per session: Not on file  . Stress: Not on file  Relationships  . Social Herbalist on phone: Not on file    Gets together: Not on file    Attends religious service: Not on file    Active member of club or organization: Not on file    Attends meetings of clubs or organizations: Not on file    Relationship status: Not on file  . Intimate partner violence    Fear of current or ex partner: Not on file    Emotionally abused: Not on file    Physically abused: Not on file    Forced sexual activity: Not on file  Other Topics Concern  . Not on file  Social History Narrative  . Not on file   Review of Systems  Constitutional: Negative for fever and malaise/fatigue.  HENT: Negative for hearing loss.   Eyes: Negative for blurred vision and double vision.  Respiratory: Negative for cough, hemoptysis, sputum production, shortness of breath and wheezing.   Cardiovascular: Negative for chest pain and palpitations.  Gastrointestinal: Negative for blood in stool, constipation, heartburn, melena, nausea and vomiting.  Genitourinary: Negative for dysuria.  Neurological: Negative for dizziness and loss of consciousness.  Psychiatric/Behavioral: Negative for depression and memory loss. The patient is not nervous/anxious and does not have insomnia.    BP 112/76   Pulse (!) 58   Temp 98.4 F (36.9 C) (Temporal)   Resp 16   Ht 5' 6.5" (1.689 m)   Wt 240 lb (108.9 kg)   SpO2 98%   BMI 38.16 kg/m   Physical Exam Vitals signs reviewed.  Constitutional:      Appearance: Normal appearance.  HENT:     Head: Normocephalic and atraumatic.     Right Ear: Tympanic membrane normal.     Left Ear: Tympanic membrane normal.     Nose: Nose normal.     Mouth/Throat:     Mouth: Mucous membranes are  moist.  Eyes:     Extraocular Movements: Extraocular movements intact.     Pupils: Pupils are equal, round, and reactive to light.  Neck:     Musculoskeletal: Neck supple.  Cardiovascular:     Rate and Rhythm: Normal rate and regular rhythm.     Pulses: Normal pulses.     Heart sounds: Normal heart sounds.  Pulmonary:     Effort: Pulmonary effort is normal.  Abdominal:     General: Abdomen is flat.     Palpations: Abdomen is  soft.  Neurological:     General: No focal deficit present.     Mental Status: He is alert and oriented to person, place, and time.  Psychiatric:        Mood and Affect: Mood normal.    Recent Results (from the past 2160 hour(s))  Comp Met (CMET)     Status: Abnormal   Collection Time: 06/25/19  4:39 PM  Result Value Ref Range   Glucose 91 65 - 99 mg/dL   BUN 29 (H) 8 - 27 mg/dL   Creatinine, Ser 1.43 (H) 0.76 - 1.27 mg/dL   GFR calc non Af Amer 52 (L) >59 mL/min/1.73   GFR calc Af Amer 60 >59 mL/min/1.73   BUN/Creatinine Ratio 20 10 - 24   Sodium 140 134 - 144 mmol/L   Potassium 5.0 3.5 - 5.2 mmol/L   Chloride 106 96 - 106 mmol/L   CO2 21 20 - 29 mmol/L   Calcium 10.6 (H) 8.6 - 10.2 mg/dL   Total Protein 7.9 6.0 - 8.5 g/dL   Albumin 4.6 3.8 - 4.8 g/dL   Globulin, Total 3.3 1.5 - 4.5 g/dL   Albumin/Globulin Ratio 1.4 1.2 - 2.2   Bilirubin Total 0.8 0.0 - 1.2 mg/dL   Alkaline Phosphatase 89 39 - 117 IU/L   AST 47 (H) 0 - 40 IU/L   ALT 52 (H) 0 - 44 IU/L  CBC     Status: None   Collection Time: 06/25/19  4:39 PM  Result Value Ref Range   WBC 9.7 3.4 - 10.8 x10E3/uL   RBC 4.45 4.14 - 5.80 x10E6/uL   Hemoglobin 14.0 13.0 - 17.7 g/dL   Hematocrit 41.0 37.5 - 51.0 %   MCV 92 79 - 97 fL   MCH 31.5 26.6 - 33.0 pg   MCHC 34.1 31.5 - 35.7 g/dL   RDW 12.6 11.6 - 15.4 %   Platelets 294 150 - 450 x10E3/uL  TSH     Status: None   Collection Time: 06/25/19  4:39 PM  Result Value Ref Range   TSH 1.320 0.450 - 4.500 uIU/mL  Lipid panel     Status:  Abnormal   Collection Time: 06/25/19  4:39 PM  Result Value Ref Range   Cholesterol, Total 119 100 - 199 mg/dL   Triglycerides 77 0 - 149 mg/dL   HDL 37 (L) >39 mg/dL   VLDL Cholesterol Cal 16 5 - 40 mg/dL   LDL Chol Calc (NIH) 66 0 - 99 mg/dL   Chol/HDL Ratio 3.2 0.0 - 5.0 ratio    Comment:                                   T. Chol/HDL Ratio                                             Men  Women                               1/2 Avg.Risk  3.4    3.3  Avg.Risk  5.0    4.4                                2X Avg.Risk  9.6    7.1                                3X Avg.Risk 23.4   11.0    Assessment/Plan: 1. Essential hypertension 2. Coronary artery disease involving native coronary artery of native heart without angina pectoris 3. Pure hypercholesterolemia Management per Cardiology. Euvolemic on exam today. BP normotensive and asymptomatic.   4. Elevated liver enzymes Mild, transient elevation. Suspect most likely fatty liver disease. Has lost 60 pounds which is amazing. He is encouraged to keep up with diet and exercise. Will recheck LFTs today since he has been holding NSAIDs. If still elevated will check liver US. No indication to stop statin at present due to very slight elevation.  - Comprehensive metabolic panel  5. Elevated serum creatinine Noted over the past year. Fluctuated from 1.27 to 1.6 range. Most recently at 1.43 Was instructed by Cardiology to hold NSAIDs. He has done this. Will recheck renal function today along with LFTs, PTH and Vitamin D levels. Will treat based on results. If unremarkable but CR still elevated would recommend adjustment of his ACEI or thiazide-diuretic.  - Comprehensive metabolic panel - PTH, intact (no Ca) - Vitamin D (25 hydroxy)  6. Prostate cancer screening The natural history of prostate cancer and ongoing controversy regarding screening and potential treatment outcomes of prostate cancer has been discussed with  the patient. The meaning of a false positive PSA and a false negative PSA has been discussed. He indicates understanding of the limitations of this screening test and wishes to proceed with screening PSA testing.  - PSA  7. Colon cancer screening Overdue. Discussed colonoscopy versus Cologuard. Handout given for more information on the Cologuard. He is to review with his wife and let us know how he would like to proceed.   8. Need for immunization against influenza - Flu Vaccine QUAD 36+ mos IM   Leeanne Rio, PA-C

## 2019-07-03 NOTE — Patient Instructions (Addendum)
Please go to the lab today for blood work.  I will call you with your results. We will alter treatment regimen(s) if indicated by your results.   Keep holiding off on any NSAID use (Advil, Motrin, Aleve, Goody Powders, etc). We are reassessing kidney function today. If improved will stay off and monitor. If not will need to adjust your Rx medications.  Ok to continue your cholesterol medication. I am going to set you up for an ultrasound of your liver if enzymes are still elevated.  Keep working on diet and exercise for continued weight loss.  Please read over the handout on Cologuard test for colon cancer screening. Call me when you have reviewed and have decided between this and colonoscopy.  It was very nice meeting you today. Welcome to [facility]!

## 2019-07-06 LAB — PARATHYROID HORMONE, INTACT (NO CA): PTH: 32 pg/mL (ref 14–64)

## 2019-07-08 ENCOUNTER — Other Ambulatory Visit: Payer: Self-pay

## 2019-07-08 ENCOUNTER — Ambulatory Visit (HOSPITAL_COMMUNITY)
Admission: RE | Admit: 2019-07-08 | Discharge: 2019-07-08 | Disposition: A | Discharge status: Home / Self Care | Payer: 59 | Source: Ambulatory Visit | Attending: Cardiology | Admitting: Cardiology

## 2019-07-08 DIAGNOSIS — I6523 Occlusion and stenosis of bilateral carotid arteries: Secondary | ICD-10-CM

## 2019-07-08 DIAGNOSIS — R0600 Dyspnea, unspecified: Secondary | ICD-10-CM | POA: Insufficient documentation

## 2019-07-08 DIAGNOSIS — R0602 Shortness of breath: Secondary | ICD-10-CM | POA: Diagnosis present

## 2019-07-08 DIAGNOSIS — I10 Essential (primary) hypertension: Secondary | ICD-10-CM

## 2019-07-08 DIAGNOSIS — E785 Hyperlipidemia, unspecified: Secondary | ICD-10-CM

## 2019-07-08 DIAGNOSIS — I771 Stricture of artery: Secondary | ICD-10-CM | POA: Diagnosis present

## 2019-07-08 DIAGNOSIS — I2510 Atherosclerotic heart disease of native coronary artery without angina pectoris: Secondary | ICD-10-CM | POA: Diagnosis not present

## 2019-07-08 DIAGNOSIS — R0609 Other forms of dyspnea: Secondary | ICD-10-CM

## 2019-07-09 ENCOUNTER — Other Ambulatory Visit: Payer: Self-pay | Admitting: Emergency Medicine

## 2019-07-09 ENCOUNTER — Telehealth: Payer: Self-pay | Admitting: *Deleted

## 2019-07-09 ENCOUNTER — Telehealth (HOSPITAL_COMMUNITY): Payer: Self-pay | Admitting: *Deleted

## 2019-07-09 DIAGNOSIS — R748 Abnormal levels of other serum enzymes: Secondary | ICD-10-CM

## 2019-07-09 NOTE — Telephone Encounter (Signed)
-----   Message from Charlie Pitter, [place] sent at 07/09/2019 12:49 PM EDT ----- Please call pt. I discussed study with Dr. Angelena Form. Mild carotid disease, normal subclavian flow. There is some resistance of flow in the left vertebral artery but since the subclavian arteries were normal, there is nothing new to do here. Will continue to monitor clinically. At f/u next year, Dr. Angelena Form will review timing of when/if he needs a f/u test to follow again in a year or so. Dayna Dunn PA-C

## 2019-07-09 NOTE — Telephone Encounter (Signed)
Call placed to pt re: echocardiogram results, voicemail was full and couldn't leave a message.

## 2019-07-09 NOTE — Telephone Encounter (Signed)
Patient given detailed instructions per Myocardial Perfusion Study Information Sheet for the test on 07/13/19 at 7:30. Patient notified to arrive 15 minutes early and that it is imperative to arrive on time for appointment to keep from having the test rescheduled.  If you need to cancel or reschedule your appointment, please call the office within 24 hours of your appointment. . Patient verbalized understanding.Donald Wood

## 2019-07-13 ENCOUNTER — Other Ambulatory Visit: Payer: Self-pay

## 2019-07-13 ENCOUNTER — Ambulatory Visit (HOSPITAL_COMMUNITY): Payer: 59 | Attending: Physician Assistant

## 2019-07-13 DIAGNOSIS — R0602 Shortness of breath: Secondary | ICD-10-CM | POA: Insufficient documentation

## 2019-07-13 LAB — MYOCARDIAL PERFUSION IMAGING
LV dias vol: 95 mL (ref 62–150)
LV sys vol: 36 mL
Peak HR: 71 {beats}/min
Rest HR: 56 {beats}/min
SDS: 1
SRS: 0
SSS: 1
TID: 1.09

## 2019-07-13 MED ORDER — TECHNETIUM TC 99M TETROFOSMIN IV KIT
10.9000 | PACK | Freq: Once | INTRAVENOUS | Status: AC | PRN
  Administered 2019-07-13: 10.9 via INTRAVENOUS
  Filled 2019-07-13: qty 11

## 2019-07-13 MED ORDER — TECHNETIUM TC 99M TETROFOSMIN IV KIT
31.7000 | PACK | Freq: Once | INTRAVENOUS | Status: AC | PRN
  Administered 2019-07-13: 31.7 via INTRAVENOUS
  Filled 2019-07-13: qty 32

## 2019-07-13 MED ORDER — REGADENOSON 0.4 MG/5ML IV SOLN
0.4000 mg | Freq: Once | INTRAVENOUS | Status: AC
  Administered 2019-07-13: 0.4 mg via INTRAVENOUS

## 2019-07-16 NOTE — Telephone Encounter (Signed)
Pt was made aware of results at the time of his stress test results.

## 2019-07-17 ENCOUNTER — Ambulatory Visit
Admission: RE | Admit: 2019-07-17 | Discharge: 2019-07-17 | Disposition: A | Discharge status: Home / Self Care | Payer: 59 | Source: Ambulatory Visit | Attending: Physician Assistant | Admitting: Physician Assistant

## 2019-07-17 ENCOUNTER — Other Ambulatory Visit: Payer: Self-pay | Admitting: *Deleted

## 2019-07-17 DIAGNOSIS — R748 Abnormal levels of other serum enzymes: Secondary | ICD-10-CM

## 2019-10-12 ENCOUNTER — Telehealth: Payer: Self-pay | Admitting: Physician Assistant

## 2019-10-12 NOTE — Telephone Encounter (Signed)
Pt has his NP appt in October with Selena Batten and we referred to a liver specialist. Per pt, the liver specialist recommended he get the Hep B series.  Just wanted to confirm this was OK before scheduling.

## 2019-10-13 NOTE — Telephone Encounter (Signed)
Please advise 

## 2019-10-13 NOTE — Telephone Encounter (Signed)
Pt schedule for hep B Lab results on chart

## 2019-10-13 NOTE — Telephone Encounter (Signed)
Ok to schedule for Hep B series. I am wondering if they told him he had immunity to Hep A or not. If not we would want to update these as well. Can he call Liver specialist and have them fax his Hepatitis tests to our office?

## 2019-10-16 ENCOUNTER — Telehealth: Payer: Self-pay | Admitting: Physician Assistant

## 2019-10-16 ENCOUNTER — Other Ambulatory Visit: Payer: Self-pay

## 2019-10-16 NOTE — Telephone Encounter (Signed)
OK to give Shingrix as well.

## 2019-10-16 NOTE — Telephone Encounter (Signed)
Pt is coming in Monday to start Hep B series. He would like to get the Shingles vaccine as well, wanted to be sure this was [facility].

## 2019-10-16 NOTE — Telephone Encounter (Signed)
Is it ok for patient to get the Hep B series and Shingrix at the same time on Monday? Please advise

## 2019-10-16 NOTE — Telephone Encounter (Signed)
Added Shingrix to Nurse visit

## 2019-10-19 ENCOUNTER — Other Ambulatory Visit: Payer: Self-pay

## 2019-10-19 ENCOUNTER — Ambulatory Visit (INDEPENDENT_AMBULATORY_CARE_PROVIDER_SITE_OTHER): Payer: 59

## 2019-10-19 DIAGNOSIS — Z23 Encounter for immunization: Secondary | ICD-10-CM | POA: Diagnosis not present

## 2019-10-19 DIAGNOSIS — Z9229 Personal history of other drug therapy: Secondary | ICD-10-CM

## 2019-10-19 NOTE — Progress Notes (Signed)
Donald Wood 64 y.o. male presents to office today to begin both Hep B and Shingles vaccine series per Marcelline Mates, PA. Administered ENGERIX-B 20 mcg/ 1 mL IM left arm. Administered SHINGRIX 0.5 mL right arm. Patient tolerated both injections well. Scheduled next nurse visit for one month.

## 2019-11-12 ENCOUNTER — Encounter: Payer: Self-pay | Admitting: Physician Assistant

## 2019-11-12 ENCOUNTER — Other Ambulatory Visit: Payer: Self-pay

## 2019-11-12 ENCOUNTER — Ambulatory Visit (INDEPENDENT_AMBULATORY_CARE_PROVIDER_SITE_OTHER): Payer: 59 | Admitting: Physician Assistant

## 2019-11-12 DIAGNOSIS — Z20822 Contact with and (suspected) exposure to covid-19: Secondary | ICD-10-CM

## 2019-11-12 MED ORDER — ALBUTEROL SULFATE HFA 108 (90 BASE) MCG/ACT IN AERS: 2.0000 | INHALATION_SPRAY | Freq: Four times a day (QID) | RESPIRATORY_TRACT | 1 refills | Status: AC | PRN

## 2019-11-12 MED ORDER — BENZONATATE 100 MG PO CAPS: 100.0000 mg | ORAL_CAPSULE | Freq: Three times a day (TID) | ORAL | 0 refills | Status: AC | PRN

## 2019-11-12 NOTE — Addendum Note (Signed)
Addended by: Waldon Merl on: 11/12/2019 09:39 AM   Modules accepted: Orders

## 2019-11-12 NOTE — Progress Notes (Signed)
Virtual Visit via Telephone Note  I connected with FILOMENO CROMLEY on 11/12/19 at  9:00 AM EST by telephone and verified that I am speaking with the correct person using two identifiers.  Location: Patient: Home Provider: [facility]   I discussed the limitations, risks, security and privacy concerns of performing an evaluation and management service by telephone and the availability of in person appointments. I also discussed with the patient that there may be a patient responsible charge related to this service. The patient expressed understanding and agreed to proceed.  History of Present Illness: Patient presents via phone today as he does not have a computer or smart phone to complete video visit.  Patient complaining of abrupt onset of nasal congestion, sinus pressure, cough, chest tightness and wheezing noted on waking this morning.  Unsure of fever.  Denies any overt chills or aches.  Denies loss of taste or smell or GI symptoms at present.  Denies recent travel.  Wife is just recovering from Covid.  Patient has not taken anything for symptoms.  States his wife told him he needs a Z-Pak.   Observations/Objective: No labored breathing.  Speech is clear and coherent with logical content.  Patient is alert and oriented at baseline.   Assessment and Plan: 1. Exposure to COVID-19 virus 2. Suspected COVID-19 virus infection Classic Covid symptoms.  Thankfully no alarm symptoms at present requiring emergent assessment.  We will have him sent for Covid testing to confirm.  This is also important giving he needs positive Covid test to be considered for monoclonal antibody infusion of which he would be a likely candidate given cardiac history.  Patient given information for testing through [facility].  He is going to call them immediately after visit.  Start Mucinex OTC.  Increase fluids.  Rest.  Rx Tessalon and albuterol MDI.  Instructions for use reviewed.  We will have patient start 1000  mg vitamin C once to twice daily, 1000 units D3 daily and OTC zinc supplement.  Patient already on antiplatelet.  Strict ER precautions reviewed with patient.  He is to quarantine at home until results are him.  We will make further determinations at that time.  Follow Up Instructions: I discussed the assessment and treatment plan with the patient. The patient was provided an opportunity to ask questions and all were answered. The patient agreed with the plan and demonstrated an understanding of the instructions.   The patient was advised to call back or seek an in-person evaluation if the symptoms worsen or if the condition fails to improve as anticipated.  I provided 12 minutes of non-face-to-face time during this encounter.   Piedad Climes, PA-C

## 2019-11-12 NOTE — Progress Notes (Signed)
I have discussed the procedure for the virtual visit with the patient who has given consent to proceed with assessment and treatment.   Zacharias Ridling S Adjoa Althouse, CMA     

## 2019-11-13 ENCOUNTER — Ambulatory Visit: Payer: 59 | Attending: Internal Medicine

## 2019-11-13 DIAGNOSIS — Z20822 Contact with and (suspected) exposure to covid-19: Secondary | ICD-10-CM

## 2019-11-14 LAB — NOVEL CORONAVIRUS, NAA: SARS-CoV-2, NAA: NOT DETECTED

## 2019-11-19 ENCOUNTER — Ambulatory Visit: Payer: 59

## 2019-11-27 ENCOUNTER — Ambulatory Visit: Payer: 59 | Admitting: *Deleted

## 2019-11-27 ENCOUNTER — Other Ambulatory Visit: Payer: Self-pay

## 2019-11-27 DIAGNOSIS — Z23 Encounter for immunization: Secondary | ICD-10-CM

## 2019-11-27 NOTE — Progress Notes (Unsigned)
Pt present for Hep B #1 vaccination. Pt tolerate well  Advised to schedule apt in June for Hep B #3

## 2019-11-27 NOTE — Progress Notes (Signed)
Patient presented for 2nd Hep B.  Was given ped dose of Hep B by CMA, expired.  Spoke with pharmacist about next steps. He needs to be given the full adult dose of Hep B to make sure he develops adequate immunity. Should not be charged for the vaccine today.   Safety Zone portal need to be created and patient needs to be contacted.

## 2019-11-27 NOTE — Patient Instructions (Addendum)
Called pt to explain that he needs to schedule an appointment to come back and receive another Hepatitis B vaccine.  The one he received today was expired and does provide protection.  Had to leave voicemail for patient to call back.  Called and spoke to patient.  Explained that he needs to come back into the office to receive another dose of the Hepatitis B vaccine and apologized on our behalf.  Pt verbalized understanding and stated that he would call back to the office on Monday, March 1st to schedule an appointment.

## 2019-11-30 ENCOUNTER — Other Ambulatory Visit: Payer: Self-pay

## 2019-11-30 ENCOUNTER — Ambulatory Visit (INDEPENDENT_AMBULATORY_CARE_PROVIDER_SITE_OTHER): Payer: 59

## 2019-11-30 DIAGNOSIS — Z23 Encounter for immunization: Secondary | ICD-10-CM

## 2019-11-30 NOTE — Progress Notes (Signed)
64 Year old male presents to the office for a Hepatitis B vaccine. Patient given 41mcg/mL of Hepatitis B vaccine(Recombinant) given in the left deltoid. Patient tolerated well and left the office in good condition.

## 2020-03-31 ENCOUNTER — Ambulatory Visit (INDEPENDENT_AMBULATORY_CARE_PROVIDER_SITE_OTHER): Payer: 59

## 2020-03-31 ENCOUNTER — Other Ambulatory Visit: Payer: Self-pay

## 2020-03-31 DIAGNOSIS — Z23 Encounter for immunization: Secondary | ICD-10-CM

## 2020-03-31 NOTE — Progress Notes (Signed)
Donald Argyle. Wood, 64 year old male presents to the office today for his 2nd Shingles vaccine. Vaccine given IM in patient's left deltoid. Patient tolerated well and left the office in good condition. Lana Fish, LPN

## 2020-03-31 NOTE — Progress Notes (Signed)
LPN note reviewed. 2nd Shingrix given.   Piedad Climes, PA-C

## 2020-04-19 ENCOUNTER — Other Ambulatory Visit: Payer: Self-pay | Admitting: Nurse Practitioner

## 2020-04-19 DIAGNOSIS — K7581 Nonalcoholic steatohepatitis (NASH): Secondary | ICD-10-CM

## 2020-04-29 ENCOUNTER — Ambulatory Visit
Admission: RE | Admit: 2020-04-29 | Discharge: 2020-04-29 | Disposition: A | Discharge status: Home / Self Care | Payer: 59 | Source: Ambulatory Visit | Attending: Nurse Practitioner | Admitting: Nurse Practitioner

## 2020-04-29 DIAGNOSIS — K7581 Nonalcoholic steatohepatitis (NASH): Secondary | ICD-10-CM

## 2020-06-13 ENCOUNTER — Other Ambulatory Visit: Payer: Self-pay | Admitting: Physician Assistant

## 2020-06-20 ENCOUNTER — Other Ambulatory Visit: Payer: Self-pay | Admitting: *Deleted

## 2020-06-20 ENCOUNTER — Other Ambulatory Visit: Payer: Self-pay

## 2020-06-20 MED ORDER — AMLODIPINE BESYLATE 10 MG PO TABS: 10.0000 mg | ORAL_TABLET | Freq: Every day | ORAL | 0 refills | Status: DC

## 2020-07-13 ENCOUNTER — Other Ambulatory Visit: Payer: Self-pay | Admitting: Cardiovascular Disease

## 2020-07-26 ENCOUNTER — Encounter: Payer: Self-pay | Admitting: Cardiovascular Disease

## 2020-07-26 ENCOUNTER — Ambulatory Visit: Payer: 59 | Admitting: Cardiovascular Disease

## 2020-07-26 ENCOUNTER — Other Ambulatory Visit: Payer: Self-pay

## 2020-07-26 VITALS — BP 112/70 | HR 73 | Ht 67.0 in | Wt 243.0 lb

## 2020-07-26 DIAGNOSIS — Z23 Encounter for immunization: Secondary | ICD-10-CM

## 2020-07-26 DIAGNOSIS — I10 Essential (primary) hypertension: Secondary | ICD-10-CM | POA: Diagnosis not present

## 2020-07-26 DIAGNOSIS — R011 Cardiac murmur, unspecified: Secondary | ICD-10-CM

## 2020-07-26 DIAGNOSIS — I2510 Atherosclerotic heart disease of native coronary artery without angina pectoris: Secondary | ICD-10-CM

## 2020-07-26 DIAGNOSIS — E785 Hyperlipidemia, unspecified: Secondary | ICD-10-CM | POA: Diagnosis not present

## 2020-07-26 DIAGNOSIS — I6523 Occlusion and stenosis of bilateral carotid arteries: Secondary | ICD-10-CM | POA: Diagnosis not present

## 2020-07-26 MED ORDER — NITROGLYCERIN 0.4 MG SL SUBL: 0.4000 mg | SUBLINGUAL_TABLET | SUBLINGUAL | 3 refills | Status: DC | PRN

## 2020-07-26 MED ORDER — LISINOPRIL 40 MG PO TABS: ORAL_TABLET | ORAL | 3 refills | Status: DC

## 2020-07-26 MED ORDER — ATORVASTATIN CALCIUM 80 MG PO TABS: ORAL_TABLET | ORAL | 3 refills | Status: DC

## 2020-07-26 MED ORDER — AMLODIPINE BESYLATE 10 MG PO TABS
10.0000 mg | ORAL_TABLET | Freq: Every day | ORAL | 3 refills | Status: DC
Start: 2020-07-26 — End: 2021-07-10

## 2020-07-26 MED ORDER — CLOPIDOGREL BISULFATE 75 MG PO TABS
75.0000 mg | ORAL_TABLET | Freq: Every day | ORAL | 3 refills | Status: DC
Start: 2020-07-26 — End: 2021-08-17

## 2020-07-26 MED ORDER — HYDROCHLOROTHIAZIDE 25 MG PO TABS: ORAL_TABLET | ORAL | 3 refills | Status: DC

## 2020-07-26 NOTE — Progress Notes (Signed)
Chief Complaint  Patient presents with   Follow-up    CAD     History of Present Illness: 64 yo male with history of CAD, HLD, carotid artery disease and HTN here today for cardiac follow up. He was admitted to [facility] in October 2016 with an inferior STEMI. His mid RCA was occluded. 2 drug eluting stents were placed in the mid RCA. There was moderate non-obstructive disease in the LAD and Ramus intermediate branch. Echo October 2016 with LVEF=55-60%.  Metoprolol stopped due to bradycardia. Mild carotid disease by dopplers October 2020. Nuclear stress test in October 2020 with no ischemia.   He is here today for follow up. The patient denies any chest pain, dyspnea, palpitations, lower extremity edema, orthopnea, PND, dizziness, near syncope or syncope.   Primary Care Physician: Noel Journey  Past Medical History:  Diagnosis Date   Arthritis    CAD (coronary artery disease), native coronary artery    a.07/29/15 Inferior infarct Cath showed normal left main, 30% diagonal, 60% distal LAD, 30% circumflex, occluded right coronary artery 4.0 x 38 mm and 4.0 x 16 mm Synergy drug-eluting stents to right coronary artery.   Hyperglycemia    Hyperlipidemia 07/31/2015   Hypertension    Leukocytosis    noted on labs   Mild carotid artery disease (HCC)    Obesity    Renal insufficiency    Subclavian artery stenosis (HCC)    a. ? right flow disturbed on carotid duplex 2018.    Past Surgical History:  Procedure Laterality Date   CARDIAC CATHETERIZATION N/A 07/29/2015   Procedure: Left Heart Cath and Coronary Angiography;  Surgeon: Kathleene Hazel, MD;  Location: [facility] INVASIVE CV LAB;  Service: Cardiovascular;  Laterality: N/A;   CARDIAC CATHETERIZATION N/A 07/29/2015   Procedure: Coronary Stent Intervention;  Surgeon: Kathleene Hazel, MD;  Location: [facility] INVASIVE CV LAB;  Service: Cardiovascular;  Laterality: N/A;   TOTAL KNEE ARTHROPLASTY Left 12/17/2014    Procedure: LEFT TOTAL KNEE ARTHROPLASTY;  Surgeon: Kathryne Hitch, MD;  Location: [facility] ORS;  Service: Orthopedics;  Laterality: Left;    Current Outpatient Medications  Medication Sig Dispense Refill   albuterol (VENTOLIN HFA) 108 (90 Base) MCG/ACT inhaler Inhale 2 puffs into the lungs every 6 (six) hours as needed for wheezing or shortness of breath. 6.7 g 1   amLODipine (NORVASC) 10 MG tablet Take 1 tablet (10 mg total) by mouth daily. 90 tablet 3   atorvastatin (LIPITOR) 80 MG tablet TAKE 1 TABLET BY MOUTH EVERY DAY AT 6PM. 90 tablet 3   benzonatate (TESSALON) 100 MG capsule Take 1 capsule (100 mg total) by mouth 3 (three) times daily as needed. 30 capsule 0   clopidogrel (PLAVIX) 75 MG tablet Take 1 tablet (75 mg total) by mouth daily. 90 tablet 3   hydrochlorothiazide (HYDRODIURIL) 25 MG tablet TAKE 1 TABLET BY MOUTH DAILY. 90 tablet 3   lisinopril (ZESTRIL) 40 MG tablet TAKE 1 TABLET(40 MG) BY MOUTH DAILY 90 tablet 3   nitroGLYCERIN (NITROSTAT) 0.4 MG SL tablet Place 1 tablet (0.4 mg total) under the tongue every 5 (five) minutes as needed for chest pain. 25 tablet 3   No current facility-administered medications for this visit.    Allergies  Allergen Reactions   Metoprolol Other (See Comments)    Pt trialed on metoprolol and carvedilol - bradycardic with HR 40-50 on both beta blockers.    Social History   Socioeconomic History   Marital status:  Married    Spouse name: Not on file   Number of children: 2   Years of education: Not on file   Highest education level: Not on file  Occupational History   Not on file  Tobacco Use   Smoking status: Never Smoker   Smokeless tobacco: Current User    Types: Snuff  Vaping Use   Vaping Use: Never used  Substance and Sexual Activity   Alcohol use: Yes    Alcohol/week: 0.0 standard drinks    Comment: 2-3 a month   Drug use: No   Sexual activity: Yes    Partners: Female  Other Topics Concern   Not on  file  Social History Narrative   Not on file   Social Determinants of Health   Financial Resource Strain:    Difficulty of Paying Living Expenses: Not on file  Food Insecurity:    Worried About Programme researcher, broadcasting/film/video in the Last Year: Not on file   [employer] of Food in the Last Year: Not on file  Transportation Needs:    Lack of Transportation (Medical): Not on file   Lack of Transportation (Non-Medical): Not on file  Physical Activity:    Days of Exercise per Week: Not on file   Minutes of Exercise per Session: Not on file  Stress:    Feeling of Stress : Not on file  Social Connections:    Frequency of Communication with Friends and Family: Not on file   Frequency of Social Gatherings with Friends and Family: Not on file   Attends Religious Services: Not on file   Active Member of Clubs or Organizations: Not on file   Attends Banker Meetings: Not on file   Marital Status: Not on file  Intimate Partner Violence:    Fear of Current or Ex-Partner: Not on file   Emotionally Abused: Not on file   Physically Abused: Not on file   Sexually Abused: Not on file    Family History  Problem Relation Age of Onset   CAD Mother        Mom had heart problems in her 75s   Heart attack Father    Stroke Father    Hypertension Father     Review of Systems:  As stated in the HPI and otherwise negative.   BP 112/70    Pulse 73    Ht 5\' 7"  (1.702 m)    Wt 243 lb (110.2 kg)    SpO2 97%    BMI 38.06 kg/m   Physical Examination:  General: Well developed, well nourished, NAD  HEENT: OP clear, mucus membranes moist  SKIN: warm, dry. No rashes. Neuro: No focal deficits  Musculoskeletal: Muscle strength 5/5 all ext  Psychiatric: Mood and affect normal  Neck: No JVD, no carotid bruits, no thyromegaly, no lymphadenopathy.  Lungs:Clear bilaterally, no wheezes, rhonci, crackles Cardiovascular: Regular rate and rhythm. No murmurs, gallops or rubs. Abdomen:Soft.  Bowel sounds present. Non-tender.  Extremities: No lower extremity edema. Pulses are 2 + in the bilateral DP/PT.  EKG:  EKG is ordered today. The ekg ordered today demonstrates sinus, PAC  Recent Labs: No results found for requested labs within last 8760 hours.   Lipid Panel    Component Value Date/Time   CHOL 119 06/25/2019 1639   TRIG 77 06/25/2019 1639   HDL 37 (L) 06/25/2019 1639   CHOLHDL 3.2 06/25/2019 1639   CHOLHDL 2.4 09/16/2015 0858   VLDL 11 09/16/2015 0858  LDLCALC 66 06/25/2019 1639     Wt Readings from Last 3 Encounters:  07/26/20 243 lb (110.2 kg)  07/13/19 244 lb (110.7 kg)  07/03/19 240 lb (108.9 kg)     Other studies Reviewed: Additional studies/ records that were reviewed today include: . Review of the above records demonstrates:    Assessment and Plan:   1. CAD without angina: He had an inferior STEMI in October 2016. DES x 2 mid RCA. Moderate disease in the LAD and Ramus. No chest pain. Beta blocker stopped due to bradycardia. ASA stopped due to easy bruising. Continue Plavix and statin. Echo now to assess LVEF  2. HTN: BP is well controlled. Continue current therapy  3. HLD: LDL at goal in September 2020. Continue statin. Will repeat lipids and LFTs now.   4. Carotid artery disease: Mild bilateral carotid artery disease by dopplers October 2020.    5. Cardiac murmur: Systolic murmur on exam. Will arrange an echo to assess for valvular issues  Current medicines are reviewed at length with the patient today.  The patient does not have concerns regarding medicines.  The following changes have been made:  no change  Labs/ tests ordered today include:   Orders Placed This Encounter  Procedures   Flu Vaccine QUAD 36+ mos IM   Hepatic function panel   Lipid panel   EKG 12-Lead   ECHOCARDIOGRAM COMPLETE     Disposition:   FU with me in 12 months   Signed, [name], MD 07/26/2020 3:42 PM    [facility]  [facility] [address] [place], [place], [place]  [address] Phone: [phone]; Fax: [phone]

## 2020-07-26 NOTE — Patient Instructions (Signed)
Medication Instructions:  No changes today *If you need a refill on your cardiac medications before your next appointment, please call your pharmacy*   Lab Work: Today - lipids and liver function panel  If you have labs (blood work) drawn today and your tests are completely normal, you will receive your results only by: Marland Kitchen MyChart Message (if you have MyChart) OR . A paper copy in the mail If you have any lab test that is abnormal or we need to change your treatment, we will call you to review the results.   Testing/Procedures: Your physician has requested that you have an echocardiogram. Echocardiography is a painless test that uses sound waves to create images of your heart. It provides your doctor with information about the size and shape of your heart and how well your heart's chambers and valves are working. This procedure takes approximately one hour. There are no restrictions for this procedure.   Follow-Up: At [facility], you and your health needs are our priority.  As part of our continuing mission to provide you with exceptional heart care, we have created designated Provider Care Teams.  These Care Teams include your primary Cardiologist (physician) and Advanced Practice Providers (APPs -  Physician Assistants and Nurse Practitioners) who all work together to provide you with the care you need, when you need it.  We recommend signing up for the patient portal called "MyChart".  Sign up information is provided on this After Visit Summary.  MyChart is used to connect with patients for Virtual Visits (Telemedicine).  Patients are able to view lab/test results, encounter notes, upcoming appointments, etc.  Non-urgent messages can be sent to your provider as well.   To learn more about what you can do with MyChart, go to [email or web address].    Your next appointment:   12 month(s)  The format for your next appointment:   In Person  Provider:   You may see [name], MD or one of the following Advanced Practice Providers on your designated Care Team:    Ronie Spies, PA-C  Jacolyn Reedy, PA-C   Other Instructions

## 2020-07-27 ENCOUNTER — Telehealth: Payer: Self-pay | Admitting: Cardiovascular Disease

## 2020-07-27 LAB — LIPID PANEL
Chol/HDL Ratio: 3.5 ratio (ref 0.0–5.0)
Cholesterol, Total: 134 mg/dL (ref 100–199)
HDL: 38 mg/dL — ABNORMAL LOW (ref >39)
LDL Chol Calc (NIH): 70 mg/dL (ref 0–99)
Triglycerides: 147 mg/dL (ref 0–149)
VLDL Cholesterol Cal: 26 mg/dL (ref 5–40)

## 2020-07-27 LAB — HEPATIC FUNCTION PANEL
ALT: 56 IU/L — ABNORMAL HIGH (ref 0–44)
AST: 38 IU/L (ref 0–40)
Albumin: 4.5 g/dL (ref 3.8–4.8)
Alkaline Phosphatase: 100 IU/L (ref 44–121)
Bilirubin Total: 0.9 mg/dL (ref 0.0–1.2)
Bilirubin, Direct: 0.23 mg/dL (ref 0.00–0.40)
Total Protein: 7.5 g/dL (ref 6.0–8.5)

## 2020-07-27 NOTE — Telephone Encounter (Signed)
See lab results.  

## 2020-07-27 NOTE — Telephone Encounter (Signed)
Patient returning call for lab results. 

## 2020-07-29 ENCOUNTER — Other Ambulatory Visit: Payer: Self-pay | Admitting: Physician Assistant

## 2020-08-18 ENCOUNTER — Ambulatory Visit (HOSPITAL_COMMUNITY): Payer: 59 | Attending: Cardiovascular Disease

## 2020-08-18 ENCOUNTER — Other Ambulatory Visit: Payer: Self-pay

## 2020-08-18 DIAGNOSIS — I6523 Occlusion and stenosis of bilateral carotid arteries: Secondary | ICD-10-CM | POA: Diagnosis not present

## 2020-08-18 DIAGNOSIS — Z23 Encounter for immunization: Secondary | ICD-10-CM

## 2020-08-18 DIAGNOSIS — E785 Hyperlipidemia, unspecified: Secondary | ICD-10-CM | POA: Diagnosis present

## 2020-08-18 DIAGNOSIS — I10 Essential (primary) hypertension: Secondary | ICD-10-CM | POA: Diagnosis not present

## 2020-08-18 DIAGNOSIS — R011 Cardiac murmur, unspecified: Secondary | ICD-10-CM

## 2020-08-18 DIAGNOSIS — I2510 Atherosclerotic heart disease of native coronary artery without angina pectoris: Secondary | ICD-10-CM | POA: Diagnosis not present

## 2020-08-18 LAB — ECHOCARDIOGRAM COMPLETE
Area-P 1/2: 4.31 cm2
S' Lateral: 2.2 cm

## 2020-08-18 MED ORDER — PERFLUTREN LIPID MICROSPHERE
1.0000 mL | INTRAVENOUS | Status: AC | PRN
  Administered 2020-08-18: 2 mL via INTRAVENOUS

## 2020-10-16 IMAGING — US US ABDOMEN COMPLETE W/ ELASTOGRAPHY
1 series · 12 of 25 positions shown · non-contrast
Comparison: CT 07/14/2018

CLINICAL DATA: [place]

EXAM:
ULTRASOUND ABDOMEN
ULTRASOUND HEPATIC ELASTOGRAPHY
TECHNIQUE: Sonography of the upper abdomen was performed. In addition,
ultrasound elastography evaluation of the liver was performed. A
region of interest was placed within the right lobe of the liver.
Following application of a compressive sonographic pulse, tissue
compressibility was assessed. Multiple assessments were performed at
the selected site. Median tissue compressibility was determined.
Previously, hepatic stiffness was assessed by shear wave velocity.
Based on recently published Society of Radiologists in Ultrasound
consensus article, reporting is now recommended to be performed in
the SI units of pressure (kiloPascals) representing hepatic
stiffness/elasticity. The obtained result is compared to the
published reference standards. (cACLD = compensated Advanced Chronic
Liver Disease)

[Series 1: us abdomen complete w/ elastography · 0.20mm/px · 12 of 69 slices shown]
[im 3/69]
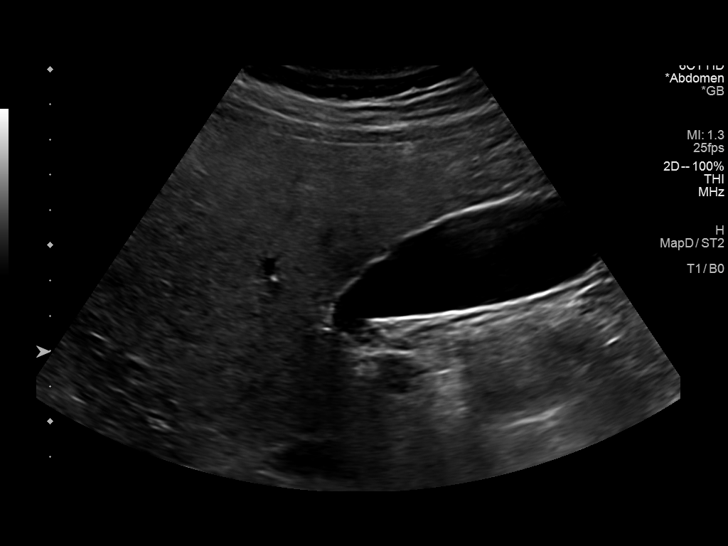
[im 9/69]
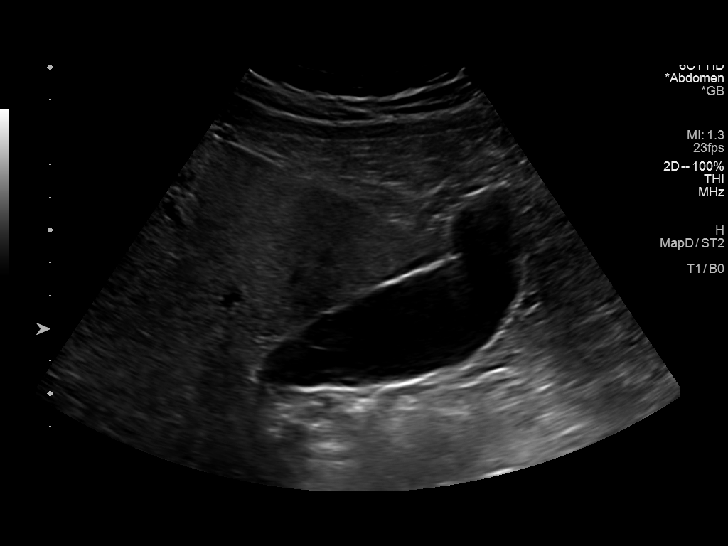
[im 15/69]
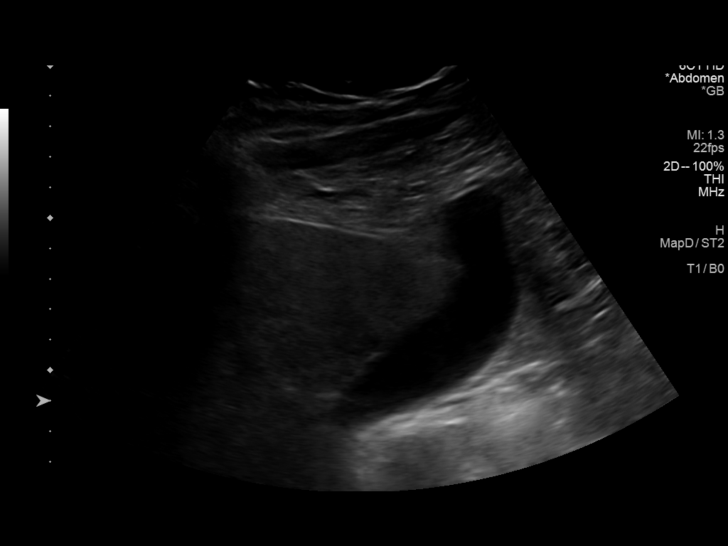
[im 20/69]
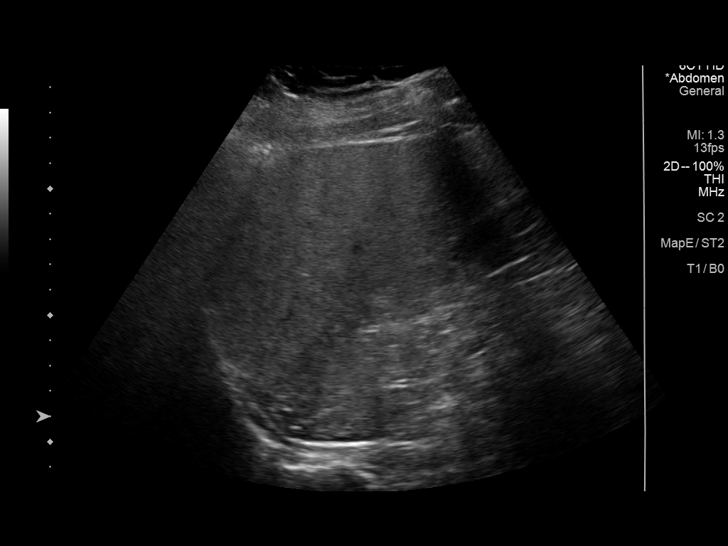
[im 26/69]
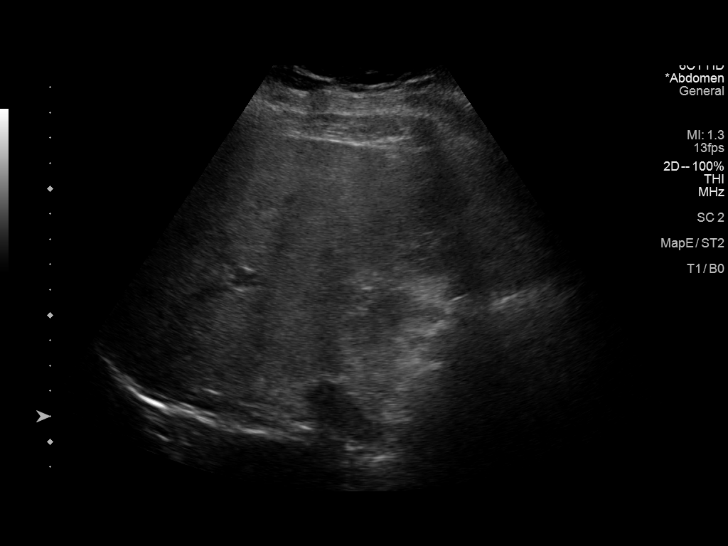
[im 32/69]
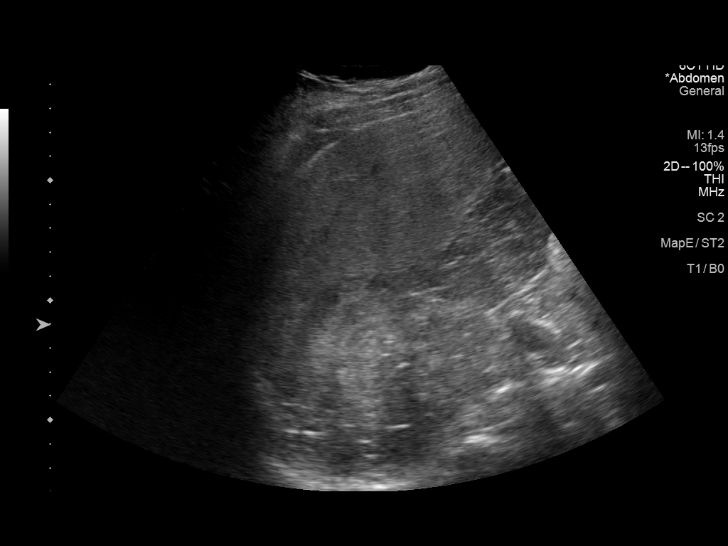
[im 37/69]
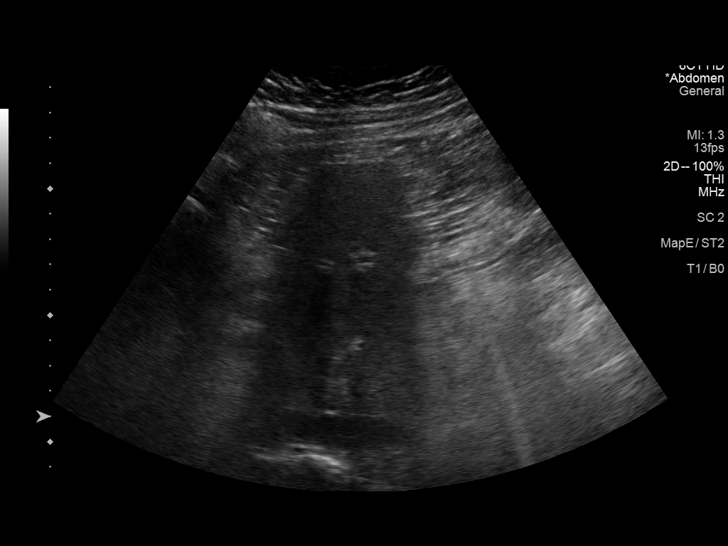
[im 43/69]
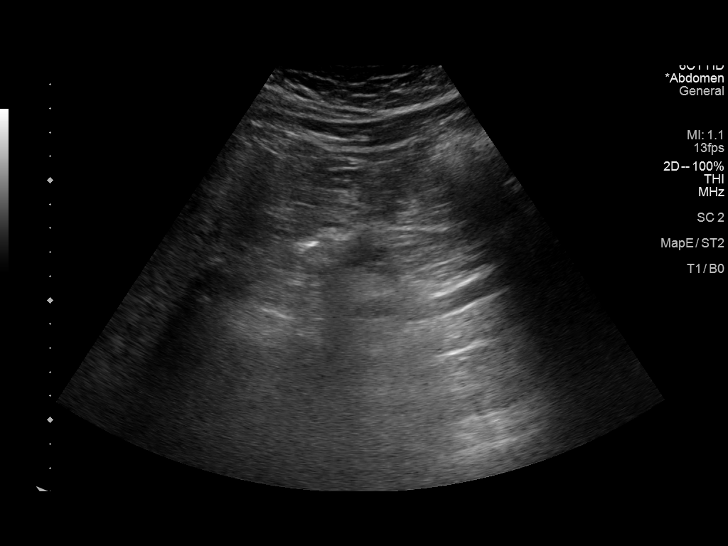
[im 49/69]
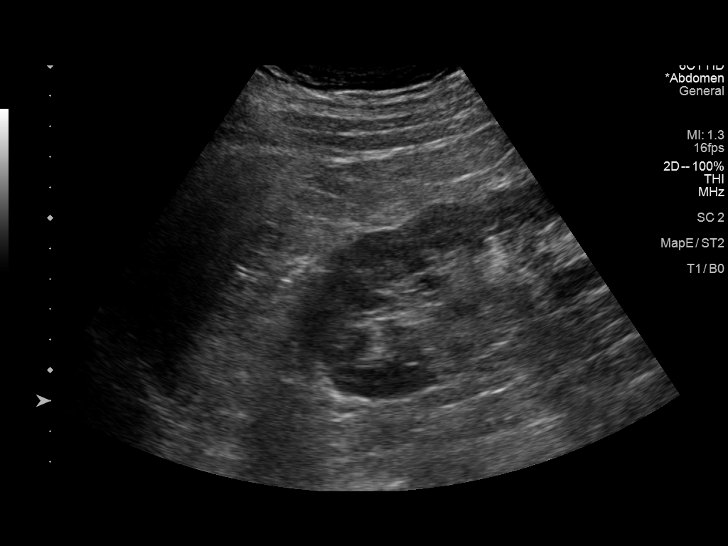
[im 54/69]
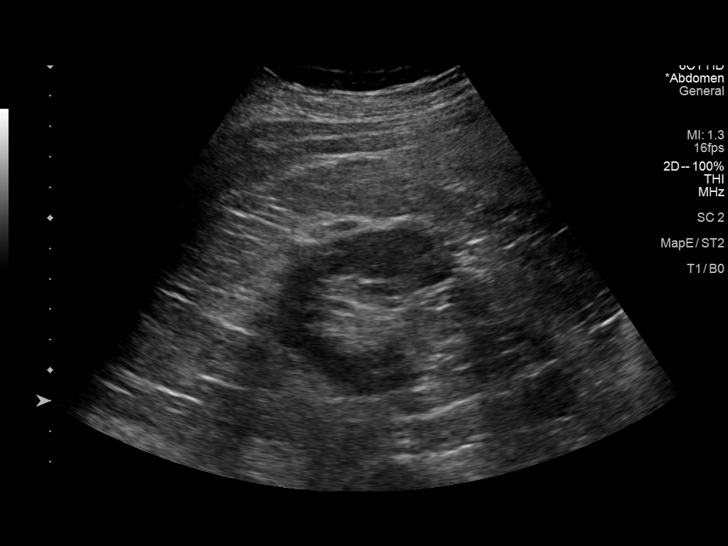
[im 60/69]
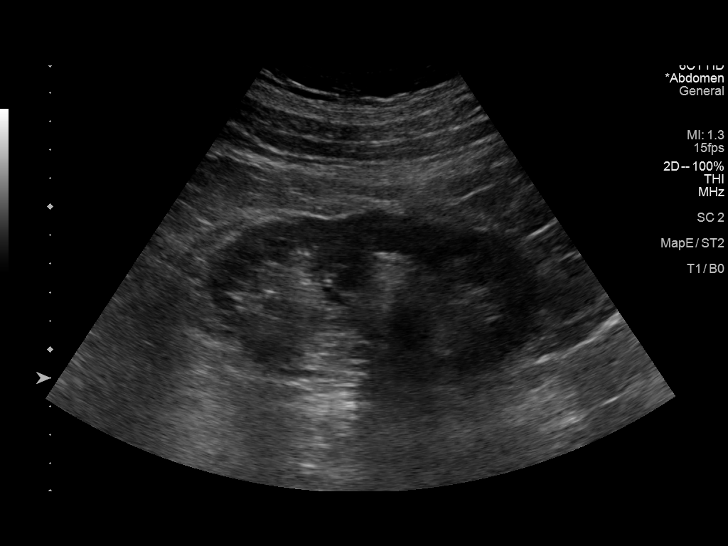
[im 66/69]
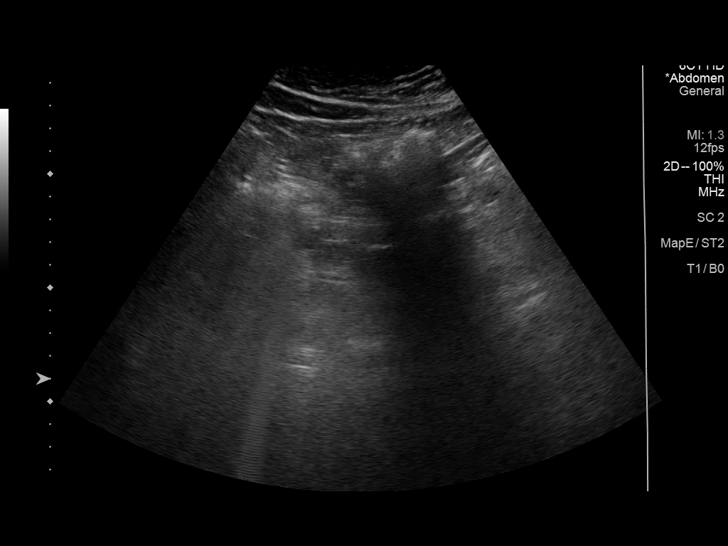

[12 of 25 positions shown; findings below may reference images not displayed]

FINDINGS: ULTRASOUND ABDOMEN

Gallbladder: No gallstones or wall thickening visualized. No
sonographic Murphy sign noted by sonographer.

Common bile duct: Diameter: Normal caliber, 4 mm

Liver: Heterogeneous, increased echotexture compatible with fatty
infiltration. No focal abnormality. Portal vein is patent on color
Doppler imaging with normal direction of blood flow towards the
liver.

IVC: Not well visualized due to overlying bowel gas.

Pancreas: Not well visualized due to overlying bowel gas.

Spleen: Size and appearance within normal limits.

Right Kidney: Length: 12.5 cm. Echogenicity within normal limits. No
mass or hydronephrosis visualized.

Left Kidney: Length: 11.9 cm. Echogenicity within normal limits. No
mass or hydronephrosis visualized.

Abdominal aorta: No aneurysm visualized.

Other findings: None.

ULTRASOUND HEPATIC ELASTOGRAPHY

Device: Siemens Helix VTQ

Patient position: Oblique

Transducer [id number]

Number of measurements: 10

Hepatic segment:  8

Median kPa:

IQR:

IQR/Median kPa ratio:

Data quality: IQR/Median kPa ratio of 0.3 or greater indicates
reduced accuracy

Diagnostic category: > or =17 kPa: highly suggestive of cACLD with
an increased probability of clinically significant portal
hypertension
IMPRESSION: ULTRASOUND ABDOMEN:

Fatty infiltration of the liver.

ULTRASOUND HEPATIC ELASTOGRAPHY:

Median kPa:

Diagnostic category: > or =17 kPa: highly suggestive of cACLD with
an increased probability of clinically significant portal
hypertension

The use of hepatic elastography is applicable to patients with viral
hepatitis and non-alcoholic fatty liver disease. At this time, there
is insufficient data for the referenced cut-off values and use in
other causes of liver disease, including alcoholic liver disease.
Patients, however, may be assessed by elastography and serve as
their own reference standard/baseline.

In patients with non-alcoholic liver disease, the values suggesting
compensated advanced chronic liver disease (cACLD) may be lower, and
patients may need additional testing with elasticity results of [DATE]
kPa.

Please note that abnormal hepatic elasticity and shear wave
velocities may also be identified in clinical settings other than
with hepatic fibrosis, such as: acute hepatitis, elevated right
heart and central venous pressures including use of beta blockers,
Arissa disease (Shigoo), infiltrative processes such as
mastocytosis/amyloidosis/infiltrative tumor/lymphoma, extrahepatic
cholestasis, with hyperemia in the post-prandial state, and with
liver transplantation. Correlation with patient history, laboratory
data, and clinical condition recommended.

Diagnostic Categories:

< or =5 kPa: high probability of being normal

< or =9 kPa: in the absence of other known clinical signs, rules [DATE] kPa and ?13 kPa: suggestive of cACLD, but needs further testing

>13 kPa: highly suggestive of cACLD

> or =17 kPa: highly suggestive of cACLD with an increased
probability of clinically significant portal hypertension

## 2021-02-06 ENCOUNTER — Telehealth: Payer: Self-pay

## 2021-02-06 NOTE — Telephone Encounter (Signed)
LVM advising patient to call back and schedule TOC. 

## 2021-07-08 ENCOUNTER — Other Ambulatory Visit: Payer: Self-pay | Admitting: Cardiovascular Disease

## 2021-08-14 ENCOUNTER — Other Ambulatory Visit: Payer: Self-pay | Admitting: Cardiovascular Disease

## 2021-08-16 ENCOUNTER — Other Ambulatory Visit: Payer: Self-pay | Admitting: Cardiovascular Disease

## 2021-08-17 ENCOUNTER — Other Ambulatory Visit: Payer: Self-pay

## 2021-08-17 MED ORDER — LISINOPRIL 40 MG PO TABS
40.0000 mg | ORAL_TABLET | Freq: Every day | ORAL | 0 refills | Status: DC
Start: 2021-08-17 — End: 2021-09-13

## 2021-08-17 MED ORDER — ATORVASTATIN CALCIUM 80 MG PO TABS
ORAL_TABLET | ORAL | 0 refills | Status: DC
Start: 2021-08-17 — End: 2021-09-13

## 2021-09-08 ENCOUNTER — Other Ambulatory Visit: Payer: Self-pay | Admitting: Cardiovascular Disease

## 2021-09-13 ENCOUNTER — Telehealth: Payer: Self-pay | Admitting: Cardiovascular Disease

## 2021-09-13 MED ORDER — AMLODIPINE BESYLATE 10 MG PO TABS: ORAL_TABLET | ORAL | 1 refills | Status: DC

## 2021-09-13 MED ORDER — NITROGLYCERIN 0.4 MG SL SUBL: 0.4000 mg | SUBLINGUAL_TABLET | SUBLINGUAL | 1 refills | Status: DC | PRN

## 2021-09-13 MED ORDER — CLOPIDOGREL BISULFATE 75 MG PO TABS: 75.0000 mg | ORAL_TABLET | Freq: Every day | ORAL | 1 refills | Status: DC

## 2021-09-13 MED ORDER — HYDROCHLOROTHIAZIDE 25 MG PO TABS: ORAL_TABLET | ORAL | 1 refills | Status: DC

## 2021-09-13 MED ORDER — ATORVASTATIN CALCIUM 80 MG PO TABS: ORAL_TABLET | ORAL | 1 refills | Status: DC

## 2021-09-13 MED ORDER — LISINOPRIL 40 MG PO TABS: 40.0000 mg | ORAL_TABLET | Freq: Every day | ORAL | 1 refills | Status: DC

## 2021-09-13 NOTE — Telephone Encounter (Signed)
Pt's medications were sent to pt's pharmacy as requested. Confirmation received.  

## 2021-09-13 NOTE — Telephone Encounter (Signed)
°*  STAT* If patient is at the pharmacy, call can be transferred to refill team.   1. Which medications need to be refilled? (please list name of each medication and dose if known) atorvastatin (LIPITOR) 80 MG tablet; clopidogrel (PLAVIX) 75 MG tablet; hydrochlorothiazide (HYDRODIURIL) 25 MG tablet; lisinopril (ZESTRIL) 40 MG tablet; nitroGLYCERIN (NITROSTAT) 0.4 MG SL tablet  2. Which pharmacy/location (including street and city if local pharmacy) is medication to be sent to? [id number] - [place], [place] - [address]  3. Do they need a 30 day or 90 day supply? 90

## 2021-10-23 ENCOUNTER — Ambulatory Visit (INDEPENDENT_AMBULATORY_CARE_PROVIDER_SITE_OTHER): Payer: Medicare Other | Admitting: Cardiovascular Disease

## 2021-10-23 ENCOUNTER — Encounter: Payer: Self-pay | Admitting: Cardiovascular Disease

## 2021-10-23 ENCOUNTER — Other Ambulatory Visit: Payer: Self-pay

## 2021-10-23 VITALS — BP 122/68 | HR 63 | Ht 67.0 in | Wt 243.6 lb

## 2021-10-23 DIAGNOSIS — I10 Essential (primary) hypertension: Secondary | ICD-10-CM

## 2021-10-23 DIAGNOSIS — I6523 Occlusion and stenosis of bilateral carotid arteries: Secondary | ICD-10-CM

## 2021-10-23 DIAGNOSIS — E785 Hyperlipidemia, unspecified: Secondary | ICD-10-CM

## 2021-10-23 DIAGNOSIS — I2510 Atherosclerotic heart disease of native coronary artery without angina pectoris: Secondary | ICD-10-CM | POA: Diagnosis not present

## 2021-10-23 LAB — LIPID PANEL
Chol/HDL Ratio: 3.6 ratio (ref 0.0–5.0)
Cholesterol, Total: 155 mg/dL (ref 100–199)
HDL: 43 mg/dL (ref >39)
LDL Chol Calc (NIH): 91 mg/dL (ref 0–99)
Triglycerides: 116 mg/dL (ref 0–149)
VLDL Cholesterol Cal: 21 mg/dL (ref 5–40)

## 2021-10-23 LAB — HEPATIC FUNCTION PANEL
ALT: 60 IU/L — ABNORMAL HIGH (ref 0–44)
AST: 42 IU/L — ABNORMAL HIGH (ref 0–40)
Albumin: 4.6 g/dL (ref 3.8–4.8)
Alkaline Phosphatase: 86 IU/L (ref 44–121)
Bilirubin Total: 1 mg/dL (ref 0.0–1.2)
Bilirubin, Direct: 0.25 mg/dL (ref 0.00–0.40)
Total Protein: 7.7 g/dL (ref 6.0–8.5)

## 2021-10-23 LAB — BASIC METABOLIC PANEL
BUN/Creatinine Ratio: 21 (ref 10–24)
BUN: 28 mg/dL — ABNORMAL HIGH (ref 8–27)
CO2: 22 mmol/L (ref 20–29)
Calcium: 10 mg/dL (ref 8.6–10.2)
Chloride: 105 mmol/L (ref 96–106)
Creatinine, Ser: 1.35 mg/dL — ABNORMAL HIGH (ref 0.76–1.27)
Glucose: 95 mg/dL (ref 70–99)
Potassium: 5.1 mmol/L (ref 3.5–5.2)
Sodium: 140 mmol/L (ref 134–144)
eGFR: 58 mL/min/{1.73_m2} — ABNORMAL LOW (ref >59)

## 2021-10-23 NOTE — Patient Instructions (Signed)
Medication Instructions:  Your physician recommends that you continue on your current medications as directed. Please refer to the Current Medication list given to you today.  *If you need a refill on your cardiac medications before your next appointment, please call your pharmacy*   Lab Work: Today: lipids, liver function and basic metabolic panel  If you have labs (blood work) drawn today and your tests are completely normal, you will receive your results only by: MyChart Message (if you have MyChart) OR A paper copy in the mail If you have any lab test that is abnormal or we need to change your treatment, we will call you to review the results.   Testing/Procedures: Your physician has requested that you have a carotid duplex. This test is an ultrasound of the carotid arteries in your neck. It looks at blood flow through these arteries that supply the brain with blood. Allow one hour for this exam. There are no restrictions or special instructions.   Follow-Up: At [facility], you and your health needs are our priority.  As part of our continuing mission to provide you with exceptional heart care, we have created designated Provider Care Teams.  These Care Teams include your primary Cardiologist (physician) and Advanced Practice Providers (APPs -  Physician Assistants and Nurse Practitioners) who all work together to provide you with the care you need, when you need it.  We recommend signing up for the patient portal called "MyChart".  Sign up information is provided on this After Visit Summary.  MyChart is used to connect with patients for Virtual Visits (Telemedicine).  Patients are able to view lab/test results, encounter notes, upcoming appointments, etc.  Non-urgent messages can be sent to your provider as well.   To learn more about what you can do with MyChart, go to [email or web address].    Your next appointment:   12 month(s)  The format for your next appointment:   In  Person  Provider:   [name], MD     Other Instructions

## 2021-10-23 NOTE — Progress Notes (Signed)
Chief Complaint  Patient presents with   Follow-up    CAD   History of Present Illness: 66 yo male with history of CAD, HLD, carotid artery disease and HTN here today for cardiac follow up. He was admitted to [facility] in October 2016 with an inferior STEMI. His mid RCA was occluded. 2 drug eluting stents were placed in the mid RCA. There was moderate non-obstructive disease in the LAD and Ramus intermediate branch. Echo October 2016 with LVEF=55-60%.  Metoprolol stopped due to bradycardia. Mild carotid disease by dopplers October 2020. Nuclear stress test in October 2020 with no ischemia. Echo November 2021 with LVEF=65-70%, moderate LVH. No valve disease.   He is here today for follow up. The patient denies any chest pain, dyspnea, palpitations, lower extremity edema, orthopnea, PND, dizziness, near syncope or syncope.   Primary Care Physician: Pcp, No  Past Medical History:  Diagnosis Date   Arthritis    CAD (coronary artery disease), native coronary artery    a.07/29/15 Inferior infarct Cath showed normal left main, 30% diagonal, 60% distal LAD, 30% circumflex, occluded right coronary artery 4.0 x 38 mm and 4.0 x 16 mm Synergy drug-eluting stents to right coronary artery.   Hyperglycemia    Hyperlipidemia 07/31/2015   Hypertension    Leukocytosis    noted on labs   Mild carotid artery disease (HCC)    Obesity    Renal insufficiency    Subclavian artery stenosis (HCC)    a. ? right flow disturbed on carotid duplex 2018.    Past Surgical History:  Procedure Laterality Date   CARDIAC CATHETERIZATION N/A 07/29/2015   Procedure: Left Heart Cath and Coronary Angiography;  Surgeon: Kathleene Hazelhristopher D Anyssa Sharpless, MD;  [facility] CV LAB;  Service: Cardiovascular;  Laterality: N/A;   CARDIAC CATHETERIZATION N/A 07/29/2015   Procedure: Coronary Stent Intervention;  Surgeon: Kathleene Hazelhristopher D Dvon Jiles, MD;  [facility];  Service: Cardiovascular;  Laterality: N/A;   TOTAL  KNEE ARTHROPLASTY Left 12/17/2014   Procedure: LEFT TOTAL KNEE ARTHROPLASTY;  Surgeon: Kathryne Hitchhristopher Y Blackman, MD;  Location: [facility] ORS;  Service: Orthopedics;  Laterality: Left;    Current Outpatient Medications  Medication Sig Dispense Refill   albuterol (VENTOLIN HFA) 108 (90 Base) MCG/ACT inhaler Inhale 2 puffs into the lungs every 6 (six) hours as needed for wheezing or shortness of breath. 6.7 g 1   amLODipine (NORVASC) 10 MG tablet TAKE 1 TABLET(10 MG) BY MOUTH DAILY 30 tablet 1   atorvastatin (LIPITOR) 80 MG tablet TAKE 1 TABLET BY MOUTH EVERY DAY AT 6PM. 30 tablet 1   benzonatate (TESSALON) 100 MG capsule Take 1 capsule (100 mg total) by mouth 3 (three) times daily as needed. 30 capsule 0   clopidogrel (PLAVIX) 75 MG tablet Take 1 tablet (75 mg total) by mouth daily. 30 tablet 1   hydrochlorothiazide (HYDRODIURIL) 25 MG tablet TAKE 1 TABLET BY MOUTH DAILY. 30 tablet 1   lisinopril (ZESTRIL) 40 MG tablet Take 1 tablet (40 mg total) by mouth daily. 30 tablet 1   nitroGLYCERIN (NITROSTAT) 0.4 MG SL tablet Place 1 tablet (0.4 mg total) under the tongue every 5 (five) minutes as needed for chest pain. 25 tablet 1   No current facility-administered medications for this visit.    Allergies  Allergen Reactions   Metoprolol Other (See Comments)    Pt trialed on metoprolol and carvedilol - bradycardic with HR 40-50 on both beta blockers.    Social History  Socioeconomic History   Marital status: Married    Spouse name: Not on file   Number of children: 2   Years of education: Not on file   Highest education level: Not on file  Occupational History   Not on file  Tobacco Use   Smoking status: Never   Smokeless tobacco: Current    Types: Snuff  Vaping Use   Vaping Use: Never used  Substance and Sexual Activity   Alcohol use: Yes    Alcohol/week: 0.0 standard drinks    Comment: 2-3 a month   Drug use: No   Sexual activity: Yes    Partners: Female  Other Topics Concern   Not  on file  Social History Narrative   Not on file   Social Determinants of Health   Financial Resource Strain: Not on file  Food Insecurity: Not on file  Transportation Needs: Not on file  Physical Activity: Not on file  Stress: Not on file  Social Connections: Not on file  Intimate Partner Violence: Not on file    Family History  Problem Relation Age of Onset   CAD Mother        Mom had heart problems in her 78s   Heart attack Father    Stroke Father    Hypertension Father     Review of Systems:  As stated in the HPI and otherwise negative.   BP 122/68    Pulse 63    Ht 5\' 7"  (1.702 m)    Wt 243 lb 9.6 oz (110.5 kg)    SpO2 97%    BMI 38.15 kg/m   Physical Examination:  General: Well developed, well nourished, NAD  HEENT: OP clear, mucus membranes moist  SKIN: warm, dry. No rashes. Neuro: No focal deficits  Musculoskeletal: Muscle strength 5/5 all ext  Psychiatric: Mood and affect normal  Neck: No JVD, no carotid bruits, no thyromegaly, no lymphadenopathy.  Lungs:Clear bilaterally, no wheezes, rhonci, crackles Cardiovascular: Regular rate and rhythm. No murmurs, gallops or rubs. Abdomen:Soft. Bowel sounds present. Non-tender.  Extremities: No lower extremity edema. Pulses are 2 + in the bilateral DP/PT.  EKG:  EKG is ordered today. The ekg ordered today demonstrates sinus. Non-specific T wave abn, unchanged  Recent Labs: No results found for requested labs within last 8760 hours.   Lipid Panel    Component Value Date/Time   CHOL 134 07/26/2020 1552   TRIG 147 07/26/2020 1552   HDL 38 (L) 07/26/2020 1552   CHOLHDL 3.5 07/26/2020 1552   CHOLHDL 2.4 09/16/2015 0858   VLDL 11 09/16/2015 0858   LDLCALC 70 07/26/2020 1552     Wt Readings from Last 3 Encounters:  10/23/21 243 lb 9.6 oz (110.5 kg)  07/26/20 243 lb (110.2 kg)  07/13/19 244 lb (110.7 kg)     Other studies Reviewed: Additional studies/ records that were reviewed today include: . Review of the  above records demonstrates:    Assessment and Plan:   1. CAD without angina: He had an inferior STEMI in October 2016. DES x 2 mid RCA. Moderate disease in the LAD and Ramus. He has no chest pain. Beta blocker stopped due to bradycardia. Will continue Plavix and statin.   2. HTN: BP is well controlled. Continue current therapy. BMET today  3. HLD: LDL at goal in October 2021. Continue statin. Will repeat lipids and LFTs now.   4. Carotid artery disease: Mild bilateral carotid artery disease by dopplers October 2020. Will repeat now.  Current medicines are reviewed at length with the patient today.  The patient does not have concerns regarding medicines.  The following changes have been made:  no change  Labs/ tests ordered today include:   Orders Placed This Encounter  Procedures   Lipid panel   Hepatic function panel   Basic metabolic panel   EKG 12-Lead   VAS US CAROTID     Disposition:   F/U with me in 12 months   Signed, [name], MD 10/23/2021 10:44 AM    [facility] [address] [place], [place], [place]  [address] Phone: [phone]; Fax: [phone]

## 2021-10-30 ENCOUNTER — Other Ambulatory Visit: Payer: Self-pay

## 2021-10-30 ENCOUNTER — Ambulatory Visit (HOSPITAL_COMMUNITY)
Admission: RE | Admit: 2021-10-30 | Discharge: 2021-10-30 | Disposition: A | Discharge status: Home / Self Care | Payer: Medicare Other | Source: Ambulatory Visit | Attending: Cardiology | Admitting: Cardiology

## 2021-10-30 DIAGNOSIS — I2510 Atherosclerotic heart disease of native coronary artery without angina pectoris: Secondary | ICD-10-CM | POA: Diagnosis not present

## 2021-10-30 DIAGNOSIS — E785 Hyperlipidemia, unspecified: Secondary | ICD-10-CM | POA: Insufficient documentation

## 2021-10-30 DIAGNOSIS — I10 Essential (primary) hypertension: Secondary | ICD-10-CM | POA: Diagnosis present

## 2021-10-30 DIAGNOSIS — I6523 Occlusion and stenosis of bilateral carotid arteries: Secondary | ICD-10-CM | POA: Insufficient documentation

## 2021-11-07 ENCOUNTER — Other Ambulatory Visit: Payer: Self-pay | Admitting: Cardiovascular Disease

## 2021-11-08 ENCOUNTER — Telehealth: Payer: Self-pay | Admitting: *Deleted

## 2021-11-08 DIAGNOSIS — E785 Hyperlipidemia, unspecified: Secondary | ICD-10-CM

## 2021-11-08 DIAGNOSIS — I2510 Atherosclerotic heart disease of native coronary artery without angina pectoris: Secondary | ICD-10-CM

## 2021-11-08 MED ORDER — EZETIMIBE 10 MG PO TABS: 10.0000 mg | ORAL_TABLET | Freq: Every day | ORAL | 3 refills | Status: DC

## 2021-11-08 NOTE — Telephone Encounter (Signed)
Spoke w patient and reviewed results.  He is taking Lipitor daily and so will add Zetia 10 mg daily and return for fasting labs in 3 months.  Appointment made.

## 2021-11-08 NOTE — Telephone Encounter (Signed)
-----   Message from Kathleene Hazel, MD sent at 10/24/2021  8:26 AM EST ----- Renal function is stable. His LFT are always mildly abnormal, no significant change. His LDL is not at goal. Can we make sure he has been taking his Lipitor every day? If he has been taking every day, we will need to add Zetia 10 mg daily to his regimen. If he has not been taking Lipitor every day, he should try to remember to do so. We can repeat his lipids and LFTs in 12 weeks. Thanks, chris

## 2021-12-01 ENCOUNTER — Other Ambulatory Visit: Payer: Self-pay | Admitting: *Deleted

## 2021-12-01 MED ORDER — AMLODIPINE BESYLATE 10 MG PO TABS
ORAL_TABLET | ORAL | 3 refills | Status: DC
Start: 2021-12-01 — End: 2022-12-24

## 2021-12-01 NOTE — Telephone Encounter (Signed)
Fax received from [facility] requested conversion to 90 day supply of amlodipine.  Refilled amlodipine to pt's pharmacy 90/3 refills.  Pt last seen in Jan 2023 by Dr. Clifton Flynn. ?

## 2022-02-07 ENCOUNTER — Other Ambulatory Visit: Payer: Medicare Other | Admitting: *Deleted

## 2022-02-07 DIAGNOSIS — I2510 Atherosclerotic heart disease of native coronary artery without angina pectoris: Secondary | ICD-10-CM

## 2022-02-07 DIAGNOSIS — E785 Hyperlipidemia, unspecified: Secondary | ICD-10-CM

## 2022-02-07 LAB — LIPID PANEL
Chol/HDL Ratio: 2.6 ratio (ref 0.0–5.0)
Cholesterol, Total: 95 mg/dL — ABNORMAL LOW (ref 100–199)
HDL: 36 mg/dL — ABNORMAL LOW (ref >39)
LDL Chol Calc (NIH): 46 mg/dL (ref 0–99)
Triglycerides: 54 mg/dL (ref 0–149)
VLDL Cholesterol Cal: 13 mg/dL (ref 5–40)

## 2022-02-12 ENCOUNTER — Encounter: Payer: Self-pay | Admitting: *Deleted

## 2022-05-22 ENCOUNTER — Telehealth: Payer: Self-pay | Admitting: Cardiovascular Disease

## 2022-05-22 NOTE — Telephone Encounter (Signed)
[facility] [address] [place] per pt request. Called pt notified of change had no questions or concerns.

## 2022-05-22 NOTE — Telephone Encounter (Signed)
   Pt requesting to delete old pharmacy and replace it to his new pharmacy [facility], [address] [place], [place] [address] Phone: [phone]  He changed insurance and can get prescription her for free

## 2022-12-22 ENCOUNTER — Other Ambulatory Visit: Payer: Self-pay | Admitting: Cardiovascular Disease

## 2022-12-24 ENCOUNTER — Other Ambulatory Visit: Payer: Self-pay | Admitting: *Deleted

## 2022-12-24 ENCOUNTER — Telehealth: Payer: Self-pay | Admitting: Cardiovascular Disease

## 2022-12-24 MED ORDER — EZETIMIBE 10 MG PO TABS: 10.0000 mg | ORAL_TABLET | Freq: Every day | ORAL | 0 refills | Status: DC

## 2022-12-24 MED ORDER — AMLODIPINE BESYLATE 10 MG PO TABS: ORAL_TABLET | ORAL | 0 refills | Status: DC

## 2022-12-24 NOTE — Telephone Encounter (Signed)
All refills have already been sent in for 30 days.

## 2022-12-24 NOTE — Telephone Encounter (Signed)
*  STAT* If patient is at the pharmacy, call can be transferred to refill team.   1. Which medications need to be refilled? (please list name of each medication and dose if known)  amLODipine (NORVASC) 10 MG tablet atorvastatin (LIPITOR) 80 MG tablet clopidogrel (PLAVIX) 75 MG tablet ezetimibe (ZETIA) 10 MG tablet hydrochlorothiazide (HYDRODIURIL) 25 MG tablet lisinopril (ZESTRIL) 40 MG table   2. Which pharmacy/location (including street and city if local pharmacy) is medication to be sent to? [id number] - [place], [place] - [address]   3. Do they need a 30 day or 90 day supply? 30 days   Pt states the pharmacy told the pt that he needs an appt. Pt has an appt on 5/10 with Richardson Dopp PA.

## 2022-12-31 ENCOUNTER — Other Ambulatory Visit: Payer: Self-pay

## 2022-12-31 MED ORDER — AMLODIPINE BESYLATE 10 MG PO TABS: ORAL_TABLET | ORAL | 1 refills | Status: DC

## 2023-01-25 ENCOUNTER — Other Ambulatory Visit: Payer: Self-pay | Admitting: *Deleted

## 2023-01-25 ENCOUNTER — Other Ambulatory Visit: Payer: Self-pay | Admitting: Cardiovascular Disease

## 2023-01-25 MED ORDER — CLOPIDOGREL BISULFATE 75 MG PO TABS: ORAL_TABLET | ORAL | 0 refills | Status: DC

## 2023-01-25 MED ORDER — ATORVASTATIN CALCIUM 80 MG PO TABS: ORAL_TABLET | ORAL | 0 refills | Status: DC

## 2023-01-25 MED ORDER — LISINOPRIL 40 MG PO TABS: 40.0000 mg | ORAL_TABLET | Freq: Every day | ORAL | 0 refills | Status: DC

## 2023-01-25 MED ORDER — HYDROCHLOROTHIAZIDE 25 MG PO TABS: 25.0000 mg | ORAL_TABLET | Freq: Every day | ORAL | 0 refills | Status: DC

## 2023-02-07 NOTE — Progress Notes (Signed)
Cardiology Office Note:    Date:  02/08/2023  ID:  Donald Wood, DOB [birth year], MRN [id number] PCP: Oneita Hurt, No  [facility] Providers Cardiologist:  [name], MD          Patient Profile:   Coronary artery disease   Inferior STEMI s/p 4 x 38 mm DES and 4 x 16 mm DES overlapping to the mid RCA in 07/2015 LHC 07/21/2015: RCA proximal 30, mid 70, 100; LCx mid 30; RI 60; LAD distal 60, D1 30 Myoview 07/12/2019: EF 63 normal perfusion, low risk TTE 08/18/2020: EF 65-70, no RWMA, moderate concentric LVH, normal RVSF Carotid artery disease Carotid US 10/30/2021: Bilateral ICA 1-39 Hypertension  Hyperlipidemia  Chronic kidney disease       History of Present Illness:   Donald Wood is a 67 y.o. male who returns for follow-up of CAD.  He was last seen by Dr. Clifton Revel 10/23/2021. He is here alone. He has not had chest pain, shortness of breath, syncope, orthopnea, leg edema. He occasionally gets lightheaded if he stands quickly. He is a non-smoker.   Review of Systems  HENT:         +allergies >> PCP has him on Albuterol prn  Gastrointestinal:  Negative for hematochezia and melena.  Genitourinary:  Negative for hematuria.   See HPI    Studies Reviewed:    EKG: Sinus bradycardia, HR 52, inferior Q waves, QTc 420, no change from prior tracing   Risk Assessment/Calculations:             Physical Exam:   VS:  BP 108/60   Pulse (!) 52   Ht 5\' 8"  (1.727 m)   Wt 227 lb (103 kg)   SpO2 96%   BMI 34.52 kg/m    Wt Readings from Last 3 Encounters:  02/08/23 227 lb (103 kg)  10/23/21 243 lb 9.6 oz (110.5 kg)  07/26/20 243 lb (110.2 kg)    Constitutional:      Appearance: Healthy appearance. Not in distress.  Neck:     Vascular: No carotid bruit. JVD normal.  Pulmonary:     Breath sounds: Diffuse Wheezing (faint) present. No rales.  Cardiovascular:     Normal rate. Regular rhythm. Normal S1. Normal S2.      Murmurs: There is no murmur.  Edema:    Peripheral  edema absent.  Abdominal:     Palpations: Abdomen is soft.       ASSESSMENT AND PLAN:   CAD (coronary artery disease), native coronary artery S/p inferior STEMI in October 2016 treated with overlapping DES x 2 to the mid RCA.  Myoview in 2020 was low risk.  EF was normal on echocardiogram in 2021.  He is doing well without chest discomfort to suggest angina.  Continue Lipitor 80 mg daily, Plavix 75 mg daily.  Follow-up 1 year.  Essential hypertension Blood pressure is well-controlled.  He does get lightheaded at times when he stands up.  I have asked him to notify us if this gets worse.  If so, I would cut back on his hydrochlorothiazide.  Continue amlodipine 10 mg daily, hydrochlorothiazide 25 mg daily, lisinopril 40 mg daily.  Hyperlipidemia He did have sweetened tea this morning.  Continue Lipitor 80 mg daily.  Obtain direct LDL, CMET today.  Obesity He has been working on weight loss.  He cut out soft drinks.     Dispo:  Return in about 1 year (around 02/08/2024) for Routine Follow Up with Dr.  McAlhany.  Signed, [name], PA-C

## 2023-02-08 ENCOUNTER — Encounter: Payer: Self-pay | Admitting: Physician Assistant

## 2023-02-08 ENCOUNTER — Ambulatory Visit: Payer: Medicare HMO | Attending: Physician Assistant | Admitting: Physician Assistant

## 2023-02-08 VITALS — BP 108/60 | HR 52 | Ht 68.0 in | Wt 227.0 lb

## 2023-02-08 DIAGNOSIS — Z6834 Body mass index (BMI) 34.0-34.9, adult: Secondary | ICD-10-CM

## 2023-02-08 DIAGNOSIS — E7800 Pure hypercholesterolemia, unspecified: Secondary | ICD-10-CM | POA: Diagnosis not present

## 2023-02-08 DIAGNOSIS — I10 Essential (primary) hypertension: Secondary | ICD-10-CM

## 2023-02-08 DIAGNOSIS — I2510 Atherosclerotic heart disease of native coronary artery without angina pectoris: Secondary | ICD-10-CM

## 2023-02-08 DIAGNOSIS — E6609 Other obesity due to excess calories: Secondary | ICD-10-CM | POA: Diagnosis not present

## 2023-02-08 LAB — COMPREHENSIVE METABOLIC PANEL
ALT: 77 IU/L — ABNORMAL HIGH (ref 0–44)
AST: 50 IU/L — ABNORMAL HIGH (ref 0–40)
Albumin/Globulin Ratio: 1.3 (ref 1.2–2.2)
Albumin: 4.4 g/dL (ref 3.9–4.9)
Alkaline Phosphatase: 97 IU/L (ref 44–121)
BUN/Creatinine Ratio: 25 — ABNORMAL HIGH (ref 10–24)
BUN: 37 mg/dL — ABNORMAL HIGH (ref 8–27)
Bilirubin Total: 1.2 mg/dL (ref 0.0–1.2)
CO2: 21 mmol/L (ref 20–29)
Calcium: 10.2 mg/dL (ref 8.6–10.2)
Chloride: 102 mmol/L (ref 96–106)
Creatinine, Ser: 1.47 mg/dL — ABNORMAL HIGH (ref 0.76–1.27)
Globulin, Total: 3.3 g/dL (ref 1.5–4.5)
Glucose: 105 mg/dL — ABNORMAL HIGH (ref 70–99)
Potassium: 4.8 mmol/L (ref 3.5–5.2)
Sodium: 138 mmol/L (ref 134–144)
Total Protein: 7.7 g/dL (ref 6.0–8.5)
eGFR: 52 mL/min/{1.73_m2} — ABNORMAL LOW (ref >59)

## 2023-02-08 LAB — LDL CHOLESTEROL, DIRECT: LDL Direct: 63 mg/dL (ref 0–99)

## 2023-02-08 NOTE — Assessment & Plan Note (Signed)
S/p inferior STEMI in October 2016 treated with overlapping DES x 2 to the mid RCA.  Myoview in 2020 was low risk.  EF was normal on echocardiogram in 2021.  He is doing well without chest discomfort to suggest angina.  Continue Lipitor 80 mg daily, Plavix 75 mg daily.  Follow-up 1 year.

## 2023-02-08 NOTE — Assessment & Plan Note (Signed)
Blood pressure is well-controlled.  He does get lightheaded at times when he stands up.  I have asked him to notify us if this gets worse.  If so, I would cut back on his hydrochlorothiazide.  Continue amlodipine 10 mg daily, hydrochlorothiazide 25 mg daily, lisinopril 40 mg daily.

## 2023-02-08 NOTE — Assessment & Plan Note (Signed)
He has been working on weight loss.  He cut out soft drinks.

## 2023-02-08 NOTE — Patient Instructions (Signed)
Medication Instructions:  Your physician recommends that you continue on your current medications as directed. Please refer to the Current Medication list given to you today.  *If you need a refill on your cardiac medications before your next appointment, please call your pharmacy*   Lab Work: TODAY:  DIRECT LDL & CMET  If you have labs (blood work) drawn today and your tests are completely normal, you will receive your results only by: MyChart Message (if you have MyChart) OR A paper copy in the mail If you have any lab test that is abnormal or we need to change your treatment, we will call you to review the results.   Testing/Procedures: None ordered   Follow-Up: At [facility], you and your health needs are our priority.  As part of our continuing mission to provide you with exceptional heart care, we have created designated Provider Care Teams.  These Care Teams include your primary Cardiologist (physician) and Advanced Practice Providers (APPs -  Physician Assistants and Nurse Practitioners) who all work together to provide you with the care you need, when you need it.  We recommend signing up for the patient portal called "MyChart".  Sign up information is provided on this After Visit Summary.  MyChart is used to connect with patients for Virtual Visits (Telemedicine).  Patients are able to view lab/test results, encounter notes, upcoming appointments, etc.  Non-urgent messages can be sent to your provider as well.   To learn more about what you can do with MyChart, go to [email or web address].    Your next appointment:   1 year(s)  Provider:   Christopher McAlhany, MD     Other Instructions  

## 2023-02-08 NOTE — Assessment & Plan Note (Signed)
He did have sweetened tea this morning.  Continue Lipitor 80 mg daily.  Obtain direct LDL, CMET today.

## 2023-02-13 ENCOUNTER — Telehealth: Payer: Self-pay | Admitting: *Deleted

## 2023-02-13 DIAGNOSIS — Z79899 Other long term (current) drug therapy: Secondary | ICD-10-CM

## 2023-02-13 MED ORDER — HYDROCHLOROTHIAZIDE 25 MG PO TABS: 12.5000 mg | ORAL_TABLET | Freq: Every day | ORAL | 3 refills | Status: DC

## 2023-02-13 NOTE — Telephone Encounter (Signed)
-----   Message from Beatrice Lecher, [place] sent at 02/10/2023  9:40 PM EDT ----- Creatinine increased. K+ normal. LFTs elevated (similar to previous values). LDL at goal. PLAN:  -Decrease HCTZ to 12.5 mg once daily -BMET 1 week -Ask pt if he has ever seen GI for elevated LFTs or does he see GI for another reason? -If no, refer to GI for elevated LFTs -If he has a GI, arrange f/u for eval of LFTs  [name], PA-C    02/10/2023 9:37 PM

## 2023-02-21 ENCOUNTER — Ambulatory Visit: Payer: Medicare HMO | Attending: Physician Assistant

## 2023-02-21 DIAGNOSIS — Z79899 Other long term (current) drug therapy: Secondary | ICD-10-CM

## 2023-02-22 ENCOUNTER — Telehealth: Payer: Self-pay | Admitting: Physician Assistant

## 2023-02-22 LAB — BASIC METABOLIC PANEL
BUN/Creatinine Ratio: 23 (ref 10–24)
BUN: 27 mg/dL (ref 8–27)
CO2: 19 mmol/L — ABNORMAL LOW (ref 20–29)
Calcium: 9.9 mg/dL (ref 8.6–10.2)
Chloride: 107 mmol/L — ABNORMAL HIGH (ref 96–106)
Creatinine, Ser: 1.15 mg/dL (ref 0.76–1.27)
Glucose: 101 mg/dL — ABNORMAL HIGH (ref 70–99)
Potassium: 4.7 mmol/L (ref 3.5–5.2)
Sodium: 139 mmol/L (ref 134–144)
eGFR: 70 mL/min/{1.73_m2} (ref >59)

## 2023-02-22 NOTE — Telephone Encounter (Signed)
Pt calling for lab results.

## 2023-02-22 NOTE — Telephone Encounter (Signed)
Spoke with pt and advised of lab results as below per [facility].  Pt verbalizes understanding and agrees with current plan.  Beatrice Lecher, PA-C 02/22/2023  7:59 AM EDT     Creatinine improved to normal. K+ normal. PLAN: - Continue current medications/treatment plan and follow up as scheduled. [name], PA-C   02/22/2023 7:58 AM

## 2023-02-24 ENCOUNTER — Other Ambulatory Visit: Payer: Self-pay | Admitting: Cardiovascular Disease

## 2023-02-26 ENCOUNTER — Other Ambulatory Visit: Payer: Self-pay

## 2023-02-26 ENCOUNTER — Other Ambulatory Visit: Payer: Self-pay | Admitting: *Deleted

## 2023-02-26 MED ORDER — LISINOPRIL 40 MG PO TABS: 40.0000 mg | ORAL_TABLET | Freq: Every day | ORAL | 3 refills | Status: DC

## 2023-02-26 MED ORDER — ATORVASTATIN CALCIUM 80 MG PO TABS: ORAL_TABLET | ORAL | 3 refills | Status: DC

## 2023-02-26 MED ORDER — CLOPIDOGREL BISULFATE 75 MG PO TABS: ORAL_TABLET | ORAL | 3 refills | Status: DC

## 2023-02-28 MED ORDER — HYDROCHLOROTHIAZIDE 12.5 MG PO TABS: 12.5000 mg | ORAL_TABLET | Freq: Every day | ORAL | 3 refills | Status: DC

## 2023-03-04 ENCOUNTER — Other Ambulatory Visit: Payer: Self-pay

## 2023-03-04 MED ORDER — CLOPIDOGREL BISULFATE 75 MG PO TABS: ORAL_TABLET | ORAL | 3 refills | Status: DC

## 2023-03-04 MED ORDER — ATORVASTATIN CALCIUM 80 MG PO TABS: ORAL_TABLET | ORAL | 3 refills | Status: DC

## 2023-03-04 MED ORDER — LISINOPRIL 40 MG PO TABS: 40.0000 mg | ORAL_TABLET | Freq: Every day | ORAL | 3 refills | Status: DC

## 2023-03-04 NOTE — Addendum Note (Signed)
Addended by: Margaret Pyle D on: 03/04/2023 02:35 PM   Modules accepted: Orders

## 2023-07-22 ENCOUNTER — Other Ambulatory Visit: Payer: Self-pay | Admitting: Cardiovascular Disease

## 2023-07-23 ENCOUNTER — Other Ambulatory Visit: Payer: Self-pay

## 2023-07-23 MED ORDER — HYDROCHLOROTHIAZIDE 12.5 MG PO TABS: 12.5000 mg | ORAL_TABLET | Freq: Every day | ORAL | 1 refills | Status: DC

## 2023-10-23 ENCOUNTER — Encounter: Payer: Self-pay | Admitting: Orthopaedic Surgery

## 2023-10-23 ENCOUNTER — Other Ambulatory Visit (INDEPENDENT_AMBULATORY_CARE_PROVIDER_SITE_OTHER): Payer: Medicare Other

## 2023-10-23 ENCOUNTER — Ambulatory Visit: Payer: Medicare Other | Admitting: Orthopaedic Surgery

## 2023-10-23 DIAGNOSIS — M25561 Pain in right knee: Secondary | ICD-10-CM

## 2023-10-23 DIAGNOSIS — M1711 Unilateral primary osteoarthritis, right knee: Secondary | ICD-10-CM | POA: Diagnosis not present

## 2023-10-23 DIAGNOSIS — G8929 Other chronic pain: Secondary | ICD-10-CM

## 2023-10-23 NOTE — Progress Notes (Signed)
The patient is well-known to me.  He is 68 years old and comes in today with significant right knee pain has been getting worse for over a year now.  We have replaced his left knee in the past back in 2016.  He says he has a lot of locking and catching in his knee.  It does wake him up at night.  At this point it is detrimentally fitting his mobility, his quality of life and his actives daily living.  His pain can be 10 out of 10.  He is on Plavix.  He does see his cardiologist once a year and has had stents.  He has been stable from a cardiac standpoint.  He is interested in knee replacement surgery for his right knee given the failure conservative treatment has continued pain.  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  Examination of the right knee shows varus malalignment that is correctable.  There is mild effusion.  There is medial and patellofemoral tenderness and pain throughout the arc of motion of his right knee.  It is ligamentously stable.  We did go over his x-rays and discussed in detail knee replacement surgery.  Having had this before he is fully aware of the risks and benefits of the surgery and what to expect from an intraoperative and postoperative standpoint.  He understands that we would need him to stop his Plavix 7 days prior to surgery.  All questions and concerns were answered and addressed.  We will work on getting him scheduled for right total knee replacement.

## 2023-11-13 ENCOUNTER — Other Ambulatory Visit: Payer: Self-pay

## 2023-11-15 ENCOUNTER — Telehealth: Payer: Self-pay

## 2023-11-15 NOTE — Telephone Encounter (Signed)
   Pre-operative Risk Assessment    Patient Name: Donald Wood  DOB: 1956-09-26 MRN: [id number]   Date of last office visit: 02/08/2023 [name], PA-C Date of next office visit: NA   Request for Surgical Clearance    Procedure:   Right total knee arthroplasty  Date of Surgery:  Clearance 12/06/23                                 Surgeon:  Doneen Poisson, MD Surgeon's Group or Practice Name:  [facility] Phone number: [phone] Fax number:  [phone]   Type of Clearance Requested:   - Medical  - Pharmacy:  Hold Clopidogrel (Plavix) Hold for 7 days pre-op   Type of Anesthesia:  Spinal   Additional requests/questions:    Elyse Jarvis   11/15/2023, 3:59 PM .

## 2023-11-15 NOTE — Telephone Encounter (Signed)
Dr. Clifton Zakaria  We have received a surgical clearance request for Mr. Donald Wood for right total knee arthroplasty.  He has a PMH of inferior STEMI in 2016 treated with overlapping DES x 2 to mid RCA.  Per our protocol guidance for holding Plavix will need to come from primary cardiologist.  Please advise on holding Plavix prior to patient's scheduled knee replacement.  Please forward you guidance and recommendations to [facility] PREOP   Thanks,  Robin Searing, NP

## 2023-11-18 NOTE — Telephone Encounter (Signed)
   Name: Donald Wood  DOB: Jul 02, 1956  MRN: [id number]  Primary Cardiologist: [name], MD   Preoperative team, please contact this patient and set up a phone call appointment for further preoperative risk assessment. Please obtain consent and complete medication review. Thank you for your help.  I confirm that guidance regarding antiplatelet and oral anticoagulation therapy has been completed and, if necessary, noted below.  Per office protocol and Dr. Clifton Amjad, he may hold Plavix for 5 days prior to procedure and should resume as soon as hemodynamically stable postoperatively.   I also confirmed the patient resides in the state of [place]. As per [facility] Medical Board telemedicine laws, the patient must reside in the state in which the provider is licensed.   Carlos Levering, NP 11/18/2023, 12:50 PM [facility]

## 2023-11-18 NOTE — Telephone Encounter (Signed)
 Ok per preop APP Donald Levering, NP ok to schedule in office if no open tele preop appt, (with not using provider slots). Pt agreeable to in office 11/20/23 with Donald Wood, PAC 1:55. Pt is grateful for the the help.   Pt said he also has a 1 yr appt that needs to be scheduled with Dr. Clifton Wood. Pt states it has been a while since he has seen Dr. Clifton Wood and would like to see MD for 1 yr f/u in May 2025.   I will update all parties involved.

## 2023-11-20 ENCOUNTER — Ambulatory Visit: Payer: Medicare Other | Admitting: Cardiology

## 2023-11-24 NOTE — Patient Instructions (Signed)
 SURGICAL WAITING ROOM VISITATION Patients having surgery or a procedure may have no more than 2 support people in the waiting area - these visitors may rotate in the visitor waiting room.   Due to an increase in RSV and influenza rates and associated hospitalizations, children ages 52 and under may not visit patients in [facility] hospitals. If the patient needs to stay at the hospital during part of their recovery, the visitor guidelines for inpatient rooms apply.  PRE-OP VISITATION  Pre-op nurse will coordinate an appropriate time for 1 support person to accompany the patient in pre-op.  This support person may not rotate.  This visitor will be contacted when the time is appropriate for the visitor to come back in the pre-op area.  Please refer to the [facility] website for the visitor guidelines for Inpatients (after your surgery is over and you are in a regular room).  You are not required to quarantine at this time prior to your surgery. However, you must do this: Hand Hygiene often Do NOT share personal items Notify your provider if you are in close contact with someone who has COVID or you develop fever 100.4 or greater, new onset of sneezing, cough, sore throat, shortness of breath or body aches.  If you test positive for Covid or have been in contact with anyone that has tested positive in the last 10 days please notify you surgeon.    Your procedure is scheduled on:  FRIDAY  December 06, 2023  Report to [facility] Main Entrance: [facility]sen entrance where the [employer] is available.   Report to admitting at:   06:00  AM  Call this number if you have any questions or problems the morning of surgery [phone]  Do not eat food after Midnight the night prior to your surgery/procedure.  After Midnight you may have the following liquids until    05:30 AM DAY OF SURGERY  Clear Liquid Diet Water Black Coffee (sugar ok, NO MILK/CREAM OR CREAMERS)  Tea (sugar ok, NO  MILK/CREAM OR CREAMERS) regular and decaf                             Plain Jell-O  with no fruit (NO RED)                                           Fruit ices (not with fruit pulp, NO RED)                                     Popsicles (NO RED)                                                                  Juice: NO CITRUS JUICES: only apple, WHITE grape, WHITE cranberry Sports drinks like Gatorade or Powerade (NO RED)                   The day of surgery:  Drink ONE (1) Pre-Surgery Clear Ensure  at  05:30  AM the  morning of surgery. Drink in one sitting. Do not sip.  This drink was given to you during your hospital pre-op appointment visit. Nothing else to drink after completing the Pre-Surgery Clear Ensure : No candy, chewing gum or throat lozenges.    FOLLOW ANY ADDITIONAL PRE OP INSTRUCTIONS YOU RECEIVED FROM YOUR SURGEON'S OFFICE!!!   Oral Hygiene is also important to reduce your risk of infection.        Remember - BRUSH YOUR TEETH THE MORNING OF SURGERY WITH YOUR REGULAR TOOTHPASTE  Do NOT smoke after Midnight the night before surgery.  STOP TAKING all Vitamins, Herbs and supplements 1 week before your surgery.   Take ONLY these medicines the morning of surgery with A SIP OF WATER: amlodipine,  You may use your Flonase Nasal spray and your Albuterol inhaler if needed. Please bring your inhaler with you on the day of surgery.                   You may not have any metal on your body including  jewelry, and body piercing  Do not wear lotions, powders, cologne, or deodorant  Men may shave face and neck.  Contacts, Hearing Aids, dentures or bridgework may not be worn into surgery. DENTURES WILL BE REMOVED PRIOR TO SURGERY PLEASE DO NOT APPLY "Poly grip" OR ADHESIVES!!!  You may bring a small overnight bag with you on the day of surgery, only pack items that are not valuable. [facility] IS NOT RESPONSIBLE   FOR VALUABLES THAT ARE LOST OR STOLEN.   Do not bring your home  medications to the hospital. The Pharmacy will dispense medications listed on your medication list to you during your admission in the Hospital.  Please read over the following fact sheets you were given: IF YOU HAVE QUESTIONS ABOUT YOUR PRE-OP INSTRUCTIONS, PLEASE CALL [phone].     Pre-operative 5 CHG Bath Instructions   You can play a key role in reducing the risk of infection after surgery. Your skin needs to be as free of germs as possible. You can reduce the number of germs on your skin by washing with CHG (chlorhexidine gluconate) soap before surgery. CHG is an antiseptic soap that kills germs and continues to kill germs even after washing.   DO NOT use if you have an allergy to chlorhexidine/CHG or antibacterial soaps. If your skin becomes reddened or irritated, stop using the CHG and notify one of our RNs at [phone]  Please shower with the CHG soap starting 4 days before surgery using the following schedule: START SHOWERS ON   MONDAY  December 02, 2023  Please keep in mind the following:  DO NOT shave, including legs and underarms, starting the day of your first shower.   You may shave your face at any point before/day of surgery.   Place clean sheets on your bed the day you start using CHG soap. Use a clean washcloth (not used since being washed) for each shower. DO NOT sleep with pets once you start using the CHG.   CHG Shower Instructions:  If you choose to wash your hair and private area, wash first with your normal shampoo/soap.  After you use shampoo/soap, rinse your hair and body thoroughly to remove shampoo/soap residue.  Turn the water OFF and apply about 3 tablespoons (45 ml) of CHG soap to a CLEAN washcloth.  Apply CHG soap ONLY FROM YOUR NECK DOWN TO YOUR TOES (washing for 3-5 minutes)  DO  NOT use CHG soap on face, private areas, open wounds, or sores.  Pay special attention to the area where your surgery is being performed.  If you are having back surgery, having someone wash your back for you may be helpful.  Wait 2 minutes after CHG soap is applied, then you may rinse off the CHG soap.  Pat dry with a clean towel  Put on clean clothes/pajamas   If you choose to wear lotion, please use ONLY the CHG-compatible lotions on the back of this paper.     Additional instructions for the day of surgery: DO NOT APPLY any lotions, deodorants, cologne, or perfumes.   Put on clean/comfortable clothes.  Brush your teeth.  Ask your nurse before applying any prescription medications to the skin.      CHG Compatible Lotions   Aveeno Moisturizing lotion  Cetaphil Moisturizing Cream  Cetaphil Moisturizing Lotion  Clairol Herbal Essence Moisturizing Lotion, Dry Skin  Clairol Herbal Essence Moisturizing Lotion, Extra Dry Skin  Clairol Herbal Essence Moisturizing Lotion, Normal Skin  Curel Age Defying Therapeutic Moisturizing Lotion with Alpha Hydroxy  Curel Extreme Care Body Lotion  Curel Soothing Hands Moisturizing Hand Lotion  Curel Therapeutic Moisturizing Cream, Fragrance-Free  Curel Therapeutic Moisturizing Lotion, Fragrance-Free  Curel Therapeutic Moisturizing Lotion, Original Formula  Eucerin Daily Replenishing Lotion  Eucerin Dry Skin Therapy Plus Alpha Hydroxy Crme  Eucerin Dry Skin Therapy Plus Alpha Hydroxy Lotion  Eucerin Original Crme  Eucerin Original Lotion  Eucerin Plus Crme Eucerin Plus Lotion  Eucerin TriLipid Replenishing Lotion  Keri Anti-Bacterial Hand Lotion  Keri Deep Conditioning Original Lotion Dry Skin Formula Softly Scented  Keri Deep Conditioning Original Lotion, Fragrance Free Sensitive Skin Formula  Keri Lotion Fast Absorbing Fragrance Free Sensitive Skin Formula  Keri Lotion Fast Absorbing Softly Scented Dry Skin Formula  Keri Original  Lotion  Keri Skin Renewal Lotion Keri Silky Smooth Lotion  Keri Silky Smooth Sensitive Skin Lotion  Nivea Body Creamy Conditioning Oil  Nivea Body Extra Enriched Lotion  Nivea Body Original Lotion  Nivea Body Sheer Moisturizing Lotion Nivea Crme  Nivea Skin Firming Lotion  NutraDerm 30 Skin Lotion  NutraDerm Skin Lotion  NutraDerm Therapeutic Skin Cream  NutraDerm Therapeutic Skin Lotion  ProShield Protective Hand Cream  Provon moisturizing lotion   FAILURE TO FOLLOW THESE INSTRUCTIONS MAY RESULT IN THE CANCELLATION OF YOUR SURGERY  PATIENT SIGNATURE_________________________________  NURSE SIGNATURE__________________________________  ________________________________________________________________________       Rogelia Mire    An incentive spirometer is a tool that can help keep your lungs clear and active. This tool measures how well you are filling your lungs with each breath. Taking  long deep breaths may help reverse or decrease the chance of developing breathing (pulmonary) problems (especially infection) following: A long period of time when you are unable to move or be active. BEFORE THE PROCEDURE  If the spirometer includes an indicator to show your best effort, your nurse or respiratory therapist will set it to a desired goal. If possible, sit up straight or lean slightly forward. Try not to slouch. Hold the incentive spirometer in an upright position. INSTRUCTIONS FOR USE  Sit on the edge of your bed if possible, or sit up as far as you can in bed or on a chair. Hold the incentive spirometer in an upright position. Breathe out normally. Place the mouthpiece in your mouth and seal your lips tightly around it. Breathe in slowly and as deeply as possible, raising the piston or the ball toward the top of the column. Hold your breath for 3-5 seconds or for as long as possible. Allow the piston or ball to fall to the bottom of the column. Remove the mouthpiece  from your mouth and breathe out normally. Rest for a few seconds and repeat Steps 1 through 7 at least 10 times every 1-2 hours when you are awake. Take your time and take a few normal breaths between deep breaths. The spirometer may include an indicator to show your best effort. Use the indicator as a goal to work toward during each repetition. After each set of 10 deep breaths, practice coughing to be sure your lungs are clear. If you have an incision (the cut made at the time of surgery), support your incision when coughing by placing a pillow or rolled up towels firmly against it. Once you are able to get out of bed, walk around indoors and cough well. You may stop using the incentive spirometer when instructed by your caregiver.  RISKS AND COMPLICATIONS Take your time so you do not get dizzy or light-headed. If you are in pain, you may need to take or ask for pain medication before doing incentive spirometry. It is harder to take a deep breath if you are having pain. AFTER USE Rest and breathe slowly and easily. It can be helpful to keep track of a log of your progress. Your caregiver can provide you with a simple table to help with this. If you are using the spirometer at home, follow these instructions: SEEK MEDICAL CARE IF:  You are having difficultly using the spirometer. You have trouble using the spirometer as often as instructed. Your pain medication is not giving enough relief while using the spirometer. You develop fever of 100.5 F (38.1 C) or higher.                                                                                                    SEEK IMMEDIATE MEDICAL CARE IF:  You cough up bloody sputum that had not been present before. You develop fever of 102 F (38.9 C) or greater. You develop worsening pain at or near the incision site. MAKE SURE YOU:  Understand these instructions. Will watch your condition. Will  get help right away if you are not doing well or get  worse. Document Released: 01/28/2007 Document Revised: 12/10/2011 Document Reviewed: 03/31/2007 [facility] Patient Information 2014 [place], [place].     If you would like to see a video about joint replacement:   [email or web address]

## 2023-11-24 NOTE — Progress Notes (Signed)
 COVID Vaccine received:  []  No [x]  Yes Date of any COVID positive Test in last 90 days:   none  PCP - [employer]  ?MD    [phone] Cardiologist - Earney Hamburg, MD   Chest x-ray - 07-29-2015  1v  Epic EKG -  02-08-2023  Epic Stress Test - Lexiscan 07-13-2019 Epic ECHO -08-18-2020  Epic  Cardiac Cath - 07-29-2015  LHC/Cors- DES x 2  by Dr. Clifton Gilberto  PCR screen: [x]  Ordered & Completed []   No Order but Needs PROFEND     []   N/A for this surgery  Surgery Plan:  []  Ambulatory   [x]  Outpatient in bed  []  Admit Anesthesia:    []  General  [x]  Spinal  []   Choice []   MAC  Pacemaker / ICD device [x]  No []  Yes   Spinal Cord Stimulator:[x]  No []  Yes       History of Sleep Apnea? [x]  No []  Yes   CPAP used?- [x]  No []  Yes    Does the patient monitor blood sugar?   [x]  N/A   []  No []  Yes  Patient has: [x]  NO Hx DM   []  Pre-DM   []  DM1  []   DM2  Blood Thinner / Instructions:Plavix  Hold x 5 days per Dr. Clifton Karman.  Patient aware Aspirin Instructions:  none  ERAS Protocol Ordered: []  No  [x]  Yes PRE-SURGERY [x]  ENSURE  []  G2  Patient is to be NPO after: 05:30  Dental hx: []  Dentures:  [x]  N/A      []  Bridge or Partial:                   []  Loose or Damaged teeth:   Comments: Patient was given the 5 CHG shower / bath instructions for TKA surgery along with 2 bottles of the CHG soap. Patient will start this on:  12-02-2023 All questions were asked and answered, Patient voiced understanding of this process.   Activity level: Patient is able to climb a flight of stairs without difficulty; [x]  No CP  [x]  No SOB, but would have leg pain.  Patient can perform ADLs without assistance.   Anesthesia review: CAD- STEMI 2016, Bradycardia (Pt has cardiac clearance appt 11-28-23 with Jari Favre, PA), HTN, Fatty liver- ?LFTs.   Patient denies shortness of breath, fever, cough and chest pain at PAT appointment.  Patient verbalized understanding and agreement to the Pre-Surgical Instructions that  were given to them at this PAT appointment. Patient was also educated of the need to review these PAT instructions again prior to his surgery.I reviewed the appropriate phone numbers to call if they have any and questions or concerns.

## 2023-11-26 ENCOUNTER — Encounter (HOSPITAL_COMMUNITY): Payer: Self-pay

## 2023-11-26 ENCOUNTER — Encounter (HOSPITAL_COMMUNITY)
Admission: RE | Admit: 2023-11-26 | Discharge: 2023-11-26 | Disposition: A | Discharge status: Home / Self Care | Payer: Medicare Other | Source: Ambulatory Visit | Attending: Orthopaedic Surgery | Admitting: Orthopaedic Surgery

## 2023-11-26 ENCOUNTER — Other Ambulatory Visit: Payer: Self-pay

## 2023-11-26 VITALS — BP 138/70 | HR 65 | Temp 97.8°F | Resp 20 | Ht 67.0 in | Wt 229.0 lb

## 2023-11-26 DIAGNOSIS — R7989 Other specified abnormal findings of blood chemistry: Secondary | ICD-10-CM | POA: Insufficient documentation

## 2023-11-26 DIAGNOSIS — Z01818 Encounter for other preprocedural examination: Secondary | ICD-10-CM | POA: Diagnosis present

## 2023-11-26 DIAGNOSIS — Z01812 Encounter for preprocedural laboratory examination: Secondary | ICD-10-CM | POA: Diagnosis not present

## 2023-11-26 DIAGNOSIS — I10 Essential (primary) hypertension: Secondary | ICD-10-CM | POA: Diagnosis not present

## 2023-11-26 DIAGNOSIS — M1711 Unilateral primary osteoarthritis, right knee: Secondary | ICD-10-CM | POA: Insufficient documentation

## 2023-11-26 DIAGNOSIS — K760 Fatty (change of) liver, not elsewhere classified: Secondary | ICD-10-CM | POA: Diagnosis not present

## 2023-11-26 HISTORY — DX: Acute myocardial infarction, unspecified: I21.9

## 2023-11-26 HISTORY — DX: Other specified abnormal findings of blood chemistry: R79.89

## 2023-11-26 HISTORY — DX: Cardiac arrhythmia, unspecified: I49.9

## 2023-11-26 LAB — CBC
HCT: 43.2 % (ref 39.0–52.0)
Hemoglobin: 14.3 g/dL (ref 13.0–17.0)
MCH: 31.2 pg (ref 26.0–34.0)
MCHC: 33.1 g/dL (ref 30.0–36.0)
MCV: 94.1 fL (ref 80.0–100.0)
Platelets: 317 10*3/uL (ref 150–400)
RBC: 4.59 MIL/uL (ref 4.22–5.81)
RDW: 14.2 % (ref 11.5–15.5)
WBC: 8.1 10*3/uL (ref 4.0–10.5)
nRBC: 0 % (ref 0.0–0.2)

## 2023-11-26 LAB — COMPREHENSIVE METABOLIC PANEL
ALT: 74 U/L — ABNORMAL HIGH (ref 0–44)
AST: 46 U/L — ABNORMAL HIGH (ref 15–41)
Albumin: 4 g/dL (ref 3.5–5.0)
Alkaline Phosphatase: 75 U/L (ref 38–126)
Anion gap: 10 (ref 5–15)
BUN: 24 mg/dL — ABNORMAL HIGH (ref 8–23)
CO2: 22 mmol/L (ref 22–32)
Calcium: 9.6 mg/dL (ref 8.9–10.3)
Chloride: 107 mmol/L (ref 98–111)
Creatinine, Ser: 0.97 mg/dL (ref 0.61–1.24)
GFR, Estimated: >60 mL/min (ref >60)
Glucose, Bld: 108 mg/dL — ABNORMAL HIGH (ref 70–99)
Potassium: 4.3 mmol/L (ref 3.5–5.1)
Sodium: 139 mmol/L (ref 135–145)
Total Bilirubin: 1.1 mg/dL (ref 0.0–1.2)
Total Protein: 7.8 g/dL (ref 6.5–8.1)

## 2023-11-26 LAB — SURGICAL PCR SCREEN
MRSA, PCR: NEGATIVE
Staphylococcus aureus: NEGATIVE

## 2023-11-26 NOTE — Progress Notes (Unsigned)
 Cardiology Office Note:  .   Date:  11/28/2023  ID:  Donald Wood, DOB 1956/08/08, MRN [id number] PCP: Aviva Kluver  [facility] Providers Cardiologist:  [name], MD {  History of Present Illness: .   Donald Wood is a 68 y.o. male with a past medical history of CAD, hypertension, hyperlipidemia, and obesity here for follow-up appointment and preop clearance.  He was last seen by [name], PA-C May 2024.  Did not report any chest pain, shortness of breath, syncope, orthopnea, or leg edema.  Occasionally gets lightheaded if he stands too quickly.  Non-smoker.  Heart rate was on the low end of normal in the 50s.  He does have a history of STEMI October 2016 treated with overlapping DES x 2 to the mid RCA with Myoview in 2020 that was low risk.  EF was normal on echo in 2021.  Blood pressure was well-controlled.  He has been working on weight loss and cutting out soft drinks.  Today ,the patient, with a history of heart disease and knee pain, is seeking cardiac clearance for an upcoming right total knee replacement surgery. The patient reports no new issues with his heart and denies experiencing any chest pains or shortness of breath. The patient's heart rate is on the slower end of normal, but he reports no symptoms of dizziness or lightheadedness. The patient is currently on Plavix for his heart condition, which he will need to stop taking five to seven days before his surgery.  The patient has a history of knee pain and has previously undergone a knee replacement surgery on the other knee, which he reports has not given him any trouble since. The patient is currently experiencing significant knee pain, described as "bone against bone," and is looking forward to the upcoming surgery. 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 He also has a history of bowling, which he had to stop due to knee issues. The patient hopes to return to bowling after his knee  surgery.   Reports no shortness of breath nor dyspnea on exertion. Reports no chest pain, pressure, or tightness. No edema, orthopnea, PND. Reports no palpitations.   Discussed the use of AI scribe software for clinical note transcription with the patient, who gave verbal consent to proceed.  ROS: Pertinent ROS in HPI  Studies Reviewed: Marland Kitchen   EKG Interpretation Date/Time:  Thursday November 28 2023 08:36:46 EST Ventricular Rate:  55 PR Interval:  170 QRS Duration:  106 QT Interval:  444 QTC Calculation: 424 R Axis:   -9  Text Interpretation: Sinus bradycardia Inferior infarct (cited on or before 30-Jul-2015) When compared with ECG of 31-Jul-2015 06:55, QRS axis Shifted right QRS voltage has decreased Non-specific change in ST segment in Inferior leads T wave inversion less evident in Inferior leads T wave inversion no longer evident in Lateral leads Confirmed by Jari Favre [phone]) on 11/28/2023 8:54:18 AM    IMPRESSIONS     1. Left ventricular ejection fraction, by estimation, is 65 to 70%. The  left ventricle has normal function. The left ventricle has no regional  wall motion abnormalities. There is moderate concentric left ventricular  hypertrophy. Left ventricular  diastolic parameters were normal.   2. Right ventricular systolic function is normal. The right ventricular  size is normal.   3. The mitral valve is normal in structure. No evidence of mitral valve  regurgitation. No evidence of mitral stenosis.   4. The aortic valve  is normal in structure. Aortic valve regurgitation is  not visualized. No aortic stenosis is present.   FINDINGS   Left Ventricle: Left ventricular ejection fraction, by estimation, is 65  to 70%. The left ventricle has normal function. The left ventricle has no  regional wall motion abnormalities. The left ventricular internal cavity  size was normal in size. There is   moderate concentric left ventricular hypertrophy. Left ventricular  diastolic  parameters were normal.   Right Ventricle: The right ventricular size is normal. No increase in  right ventricular wall thickness. Right ventricular systolic function is  normal.   Left Atrium: Left atrial size was normal in size.   Right Atrium: Right atrial size was normal in size.   Pericardium: There is no evidence of pericardial effusion.   Mitral Valve: The mitral valve is normal in structure. No evidence of  mitral valve regurgitation. No evidence of mitral valve stenosis.   Tricuspid Valve: The tricuspid valve is normal in structure. Tricuspid  valve regurgitation is trivial. No evidence of tricuspid stenosis.   Aortic Valve: The aortic valve is normal in structure. Aortic valve  regurgitation is not visualized. No aortic stenosis is present.   Pulmonic Valve: The pulmonic valve was normal in structure. Pulmonic valve  regurgitation is not visualized.   Aorta: The aortic root and ascending aorta are structurally normal, with  no evidence of dilitation.   IAS/Shunts: The atrial septum is grossly normal.       Physical Exam:   VS:  BP 120/70   Pulse (!) 55   Ht 5\' 7"  (1.702 m)   Wt 232 lb 9.6 oz (105.5 kg)   SpO2 96%   BMI 36.43 kg/m    Wt Readings from Last 3 Encounters:  11/28/23 232 lb 9.6 oz (105.5 kg)  11/26/23 229 lb (103.9 kg)  02/08/23 227 lb (103 kg)    GEN: Well nourished, well developed in no acute distress NECK: No JVD; No carotid bruits CARDIAC: RRR, no murmurs, rubs, gallops RESPIRATORY:  Clear to auscultation without rales, wheezing or rhonchi  ABDOMEN: Soft, non-tender, non-distended EXTREMITIES:  No edema; No deformity   ASSESSMENT AND PLAN: .   Preop Clearance   Donald Wood's perioperative risk of a major cardiac event is 0.9% according to the Revised Cardiac Risk Index (RCRI).  Therefore, he is at low risk for perioperative complications.   His functional capacity is fair at 4.95 METs according to the Duke Activity Status Index  (DASI). Recommendations: According to ACC/AHA guidelines, no further cardiovascular testing needed.  The patient may proceed to surgery at acceptable risk.   Antiplatelet and/or Anticoagulation Recommendations: Clopidogrel (Plavix) can be held for 5-7 days prior to his surgery and resumed as soon as possible post op.  Hyperlipidemia Last lipid panel from May 2023. -Draw lipid panel today (11/28/2023) while fasting.  CAD -no chest pain or SOB -continue current medications  HTN -well controlled today, no change in medical therapy      Dispo: He can follow-up in a year with myself or Dr. Clifton Sair  Signed, Sharlene Dory, PA-C

## 2023-11-28 ENCOUNTER — Ambulatory Visit: Payer: Medicare Other | Attending: Physician Assistant | Admitting: Physician Assistant

## 2023-11-28 ENCOUNTER — Encounter: Payer: Self-pay | Admitting: Physician Assistant

## 2023-11-28 VITALS — BP 120/70 | HR 55 | Ht 67.0 in | Wt 232.6 lb

## 2023-11-28 DIAGNOSIS — I10 Essential (primary) hypertension: Secondary | ICD-10-CM | POA: Diagnosis not present

## 2023-11-28 DIAGNOSIS — Z79899 Other long term (current) drug therapy: Secondary | ICD-10-CM | POA: Diagnosis not present

## 2023-11-28 DIAGNOSIS — I2510 Atherosclerotic heart disease of native coronary artery without angina pectoris: Secondary | ICD-10-CM

## 2023-11-28 DIAGNOSIS — E7800 Pure hypercholesterolemia, unspecified: Secondary | ICD-10-CM | POA: Diagnosis not present

## 2023-11-28 LAB — LIPID PANEL
Chol/HDL Ratio: 2.8 ratio (ref 0.0–5.0)
Cholesterol, Total: 108 mg/dL (ref 100–199)
HDL: 39 mg/dL — ABNORMAL LOW (ref >39)
LDL Chol Calc (NIH): 56 mg/dL (ref 0–99)
Triglycerides: 58 mg/dL (ref 0–149)
VLDL Cholesterol Cal: 13 mg/dL (ref 5–40)

## 2023-11-28 MED ORDER — NITROGLYCERIN 0.4 MG SL SUBL: 0.4000 mg | SUBLINGUAL_TABLET | SUBLINGUAL | 3 refills | Status: AC | PRN

## 2023-11-28 MED ORDER — HYDROCHLOROTHIAZIDE 12.5 MG PO TABS: 12.5000 mg | ORAL_TABLET | Freq: Every day | ORAL | 3 refills | Status: AC

## 2023-11-28 MED ORDER — AMLODIPINE BESYLATE 10 MG PO TABS: ORAL_TABLET | ORAL | 3 refills | Status: AC

## 2023-11-28 MED ORDER — EZETIMIBE 10 MG PO TABS: 10.0000 mg | ORAL_TABLET | Freq: Every day | ORAL | 3 refills | Status: AC

## 2023-11-28 NOTE — Patient Instructions (Signed)
 Lab Work: FASTING Lipid panel  If you have labs (blood work) drawn today and your tests are completely normal, you will receive your results only by: MyChart Message (if you have MyChart) OR A paper copy in the mail If you have any lab test that is abnormal or we need to change your treatment, we will call you to review the results.  Follow-Up: At [facility], you and your health needs are our priority.  As part of our continuing mission to provide you with exceptional heart care, we have created designated Provider Care Teams.  These Care Teams include your primary Cardiologist (physician) and Advanced Practice Providers (APPs -  Physician Assistants and Nurse Practitioners) who all work together to provide you with the care you need, when you need it.  We recommend signing up for the patient portal called "MyChart".  Sign up information is provided on this After Visit Summary.  MyChart is used to connect with patients for Virtual Visits (Telemedicine).  Patients are able to view lab/test results, encounter notes, upcoming appointments, etc.  Non-urgent messages can be sent to your provider as well.   To learn more about what you can do with MyChart, go to [email or web address].    Your next appointment:   1 year(s)  Provider:   Clifton Sunny, MD or Asa Lente, [place]  Other Instructions   1st Floor: - Lobby - Registration  - Pharmacy  - Lab - Cafe  2nd Floor: - PV Lab - Diagnostic Testing (echo, CT, nuclear med)  3rd Floor: - Vacant  4th Floor: - TCTS (cardiothoracic surgery) - AFib Clinic - Structural Heart Clinic - Vascular Surgery  - Vascular Ultrasound  5th Floor: - HeartCare Cardiology (general and EP) - Clinical Pharmacy for coumadin, hypertension, lipid, weight-loss medications, and med management appointments    Valet parking services will be available as well.

## 2023-12-02 NOTE — Anesthesia Preprocedure Evaluation (Addendum)
 Anesthesia Evaluation  Patient identified by MRN, date of birth, ID band Patient awake    Reviewed: Allergy & Precautions, NPO status , Patient's Chart, lab work & pertinent test results  History of Anesthesia Complications Negative for: history of anesthetic complications  Airway Mallampati: II  TM Distance: >3 FB Neck ROM: Full    Dental no notable dental hx. (+) Teeth Intact, Dental Advisory Given   Pulmonary    Pulmonary exam normal breath sounds clear to auscultation       Cardiovascular hypertension, Pt. on medications + CAD, + Past MI (2016) and + Peripheral Vascular Disease  Normal cardiovascular exam Rhythm:Regular Rate:Normal  2021 Echo  1. Left ventricular ejection fraction, by estimation, is 65 to 70%. The  left ventricle has normal function. The left ventricle has no regional  wall motion abnormalities. There is moderate concentric left ventricular  hypertrophy. Left ventricular  diastolic parameters were normal.   2. Right ventricular systolic function is normal. The right ventricular  size is normal.   3. The mitral valve is normal in structure. No evidence of mitral valve  regurgitation. No evidence of mitral stenosis.   4. The aortic valve is normal in structure. Aortic valve regurgitation is  not visualized. No aortic stenosis is present     Neuro/Psych negative neurological ROS  negative psych ROS   GI/Hepatic   Endo/Other    Renal/GU Renal diseaseLab Results      Component                Value               Date                      K                        4.3                 11/26/2023                CO2                      22                  11/26/2023                BUN                      24 (H)              11/26/2023                CREATININE               0.97                11/26/2023                      GLUCOSE                  108 (H)             11/26/2023                 Musculoskeletal  (+) Arthritis ,    Abdominal   Peds  Hematology Pt on plavix last dose Lab Results      Component  Value               Date                          HGB                      14.3                11/26/2023                HCT                      43.2                11/26/2023                    PLT                      317                 11/26/2023              Anesthesia Other Findings All: metoprolol  Reproductive/Obstetrics                             Anesthesia Physical Anesthesia Plan  ASA: 3  Anesthesia Plan: Spinal and Regional   Post-op Pain Management: Regional block* and Minimal or no pain anticipated   Induction:   PONV Risk Score and Plan: 2 and Treatment may vary due to age or medical condition, Propofol infusion and Ondansetron  Airway Management Planned: Natural Airway and Nasal Cannula  Additional Equipment: None  Intra-op Plan:   Post-operative Plan:   Informed Consent: I have reviewed the patients History and Physical, chart, labs and discussed the procedure including the risks, benefits and alternatives for the proposed anesthesia with the patient or authorized representative who has indicated his/her understanding and acceptance.     Dental advisory given  Plan Discussed with: CRNA and Surgeon  Anesthesia Plan Comments: (See PAT note from 2/25 by K Gekas PA-C  Spinsl w R aDDDuctor )        Anesthesia Quick Evaluation

## 2023-12-02 NOTE — Progress Notes (Signed)
 Case: [id number] Date/Time: 12/06/23 0815   Procedure: RIGHT TOTAL KNEE ARTHROPLASTY (Right: Knee)   Anesthesia type: Spinal   Pre-op diagnosis: OSTEOARTHRITIS RIGHT KNEE   [facility] ROOM 10 / [facility]   Surgeons: Kathryne Hitch, MD       DISCUSSION: Donald Wood is a 68 yo male who presents to PAT prior to surgery above. PMH of hx of MI and CAD s/p PCI (2016), HTN, HLD, carotid artery disease, hepatic steatosis/fibrosis with chronic transaminitis, obesity, arthritis.  Patient follows with Cardiology for above hx. Last seen on 11/28/23 for pre op clearance. The patient reported mild dizziness with standing but otherwise denied any symptoms. Cleared for surgery:  "Preop Clearance    Donald Wood's perioperative risk of a major cardiac event is 0.9% according to the Revised Cardiac Risk Index (RCRI).  Therefore, he is at low risk for perioperative complications.   His functional capacity is fair at 4.95 METs according to the Duke Activity Status Index (DASI). Recommendations: According to ACC/AHA guidelines, no further cardiovascular testing needed.  The patient may proceed to surgery at acceptable risk.   Antiplatelet and/or Anticoagulation Recommendations: Clopidogrel (Plavix) can be held for 5-7 days prior to his surgery and resumed as soon as possible post op."  Plavix  Hold x 5 days per Dr. Clifton Madix.  Patient aware  VS: BP 138/70 Comment: right arm sitting  Pulse 65   Temp 36.6 C (Oral)   Resp 20   Ht 5\' 7"  (1.702 m)   Wt 103.9 kg   SpO2 99%   BMI 35.87 kg/m   PROVIDERS: Pcp, No Cardiologist:  [name], MD   LABS: Labs reviewed: Acceptable for surgery. (all labs ordered are listed, but only abnormal results are displayed)  Labs Reviewed  COMPREHENSIVE METABOLIC PANEL - Abnormal; Notable for the following components:      Result Value   Glucose, Bld 108 (*)    BUN 24 (*)    AST 46 (*)    ALT 74 (*)    All other components within normal limits   SURGICAL PCR SCREEN  CBC     IMAGES:   EKG 11/28/23:  Sinus bradycardia, rate 55 Inferior infarct (cited on or before 30-Jul-2015) When compared with ECG of 31-Jul-2015 06:55, QRS axis Shifted right QRS voltage has decreased Non-specific change in ST segment in Inferior leads T wave inversion less evident in Inferior leads T wave inversion no longer evident in Lateral leads  CV:  Echo 08/18/2020:  IMPRESSIONS    1. Left ventricular ejection fraction, by estimation, is 65 to 70%. The left ventricle has normal function. The left ventricle has no regional wall motion abnormalities. There is moderate concentric left ventricular hypertrophy. Left ventricular diastolic parameters were normal.  2. Right ventricular systolic function is normal. The right ventricular size is normal.  3. The mitral valve is normal in structure. No evidence of mitral valve regurgitation. No evidence of mitral stenosis.  4. The aortic valve is normal in structure. Aortic valve regurgitation is not visualized. No aortic stenosis is present.  Stress test 07/13/2019:  Nuclear stress EF: 63%. There was no ST segment deviation noted during stress. No T wave inversion was noted during stress. The study is normal. This is a low risk study. The left ventricular ejection fraction is normal (55-65%).  LHC 07/29/2015:  Mid RCA to Dist RCA lesion, 70% stenosed. Mid RCA lesion, 100% stenosed. 2 overlapping drug eluting stents placed in the mid RCA. Post intervention, there is  a 0% residual stenosis. Prox RCA lesion, 30% stenosed. Mid Cx lesion, 30% stenosed. Ramus lesion, 60% stenosed. Dist LAD lesion, 60% stenosed. 1st Diag lesion, 30% stenosed. The left ventricular systolic function is preserved with subtle hypokinesis of the inferior wall.   1. Acute inferior STEMI secondary to occluded mid RCA 2. Successful PTCA/DES x 2 mid RCA 3. Moderate disease in the Ramus intermediate branch and the  distal LAD 4. Overall preserved LV systolic function with subtle inferior wall hypokinesis.    Recommendations: Will continue ASA, Brilinta, beta blocker and statin. Echo in am. Add Ace-inh in am if BP stable. Plans for medical management of moderate disease in LAD and Intermediate branch.   Past Medical History:  Diagnosis Date   Arthritis    CAD (coronary artery disease), native coronary artery    a.07/29/15 Inferior infarct Cath showed normal left main, 30% diagonal, 60% distal LAD, 30% circumflex, occluded right coronary artery 4.0 x 38 mm and 4.0 x 16 mm Synergy drug-eluting stents to right coronary artery.   Dysrhythmia    bradycardia   Elevated liver function tests    Hyperglycemia    Hyperlipidemia 07/31/2015   Hypertension    Leukocytosis    noted on labs   Mild carotid artery disease (HCC)    Myocardial infarction [facility])    Obesity    Renal insufficiency    Subclavian artery stenosis (HCC)    a. ? right flow disturbed on carotid duplex 2018.    Past Surgical History:  Procedure Laterality Date   CARDIAC CATHETERIZATION N/A 07/29/2015   Procedure: Left Heart Cath and Coronary Angiography;  Surgeon: Kathleene Hazel, MD;  Location: [facility] INVASIVE CV LAB;  Service: Cardiovascular;  Laterality: N/A;   CARDIAC CATHETERIZATION N/A 07/29/2015   Procedure: Coronary Stent Intervention;  Surgeon: Kathleene Hazel, MD;  Location: [facility] INVASIVE CV LAB;  Service: Cardiovascular;  Laterality: N/A;   TOTAL KNEE ARTHROPLASTY Left 12/17/2014   Procedure: LEFT TOTAL KNEE ARTHROPLASTY;  Surgeon: Kathryne Hitch, MD;  Location[facility];  Service: Orthopedics;  Laterality: Left;    MEDICATIONS:  albuterol (VENTOLIN HFA) 108 (90 Base) MCG/ACT inhaler   amLODipine (NORVASC) 10 MG tablet   atorvastatin (LIPITOR) 80 MG tablet   benzonatate (TESSALON) 100 MG capsule   clopidogrel (PLAVIX) 75 MG tablet   ezetimibe (ZETIA) 10 MG tablet   fluticasone (FLONASE) 50 MCG/ACT nasal  spray   hydrochlorothiazide (HYDRODIURIL) 12.5 MG tablet   lisinopril (ZESTRIL) 40 MG tablet   nitroGLYCERIN (NITROSTAT) 0.4 MG SL tablet   No current facility-administered medications for this encounter.   [name] [facility] Short Stay/Anesthesiology [facility] Phone [phone] 12/02/2023 2:36 PM

## 2023-12-05 NOTE — H&P (Signed)
 TOTAL KNEE ADMISSION H&P  Patient is being admitted for right total knee arthroplasty.  Subjective:  Chief Complaint:right knee pain.  HPI: Donald Wood, 68 y.o. male, has a history of pain and functional disability in the right knee due to arthritis and has failed non-surgical conservative treatments for greater than 12 weeks to includeuse of assistive devices and activity modification.  Onset of symptoms was gradual, starting 2 years ago with gradually worsening course since that time. The patient noted no past surgery on the right knee(s).  Patient currently rates pain in the right knee(s) at 10 out of 10 with activity. Patient has night pain, worsening of pain with activity and weight bearing, pain that interferes with activities of daily living, pain with passive range of motion, crepitus, and joint swelling.  Patient has evidence of subchondral sclerosis, periarticular osteophytes, and joint space narrowing by imaging studies. There is no active infection.  Patient Active Problem List   Diagnosis Date Noted   Unilateral primary osteoarthritis, right knee 10/23/2023   Sinus bradycardia 08/15/2015   Bilateral carotid bruits 08/15/2015   CAD (coronary artery disease), native coronary artery    Hyperlipidemia    ST elevation myocardial infarction (STEMI) of inferior wall (HCC) 07/29/2015   Essential hypertension 07/29/2015   Hyperglycemia 07/29/2015   Obesity    Osteoarthritis of left knee 12/17/2014   Past Medical History:  Diagnosis Date   Arthritis    CAD (coronary artery disease), native coronary artery    a.07/29/15 Inferior infarct Cath showed normal left main, 30% diagonal, 60% distal LAD, 30% circumflex, occluded right coronary artery 4.0 x 38 mm and 4.0 x 16 mm Synergy drug-eluting stents to right coronary artery.   Dysrhythmia    bradycardia   Elevated liver function tests    Hyperglycemia    Hyperlipidemia 07/31/2015   Hypertension    Leukocytosis    noted on labs    Mild carotid artery disease (HCC)    Myocardial infarction [facility])    Obesity    Renal insufficiency    Subclavian artery stenosis (HCC)    a. ? right flow disturbed on carotid duplex 2018.    Past Surgical History:  Procedure Laterality Date   CARDIAC CATHETERIZATION N/A 07/29/2015   Procedure: Left Heart Cath and Coronary Angiography;  Surgeon: Kathleene Hazel, MD;  Location: [facility] INVASIVE CV LAB;  Service: Cardiovascular;  Laterality: N/A;   CARDIAC CATHETERIZATION N/A 07/29/2015   Procedure: Coronary Stent Intervention;  Surgeon: Kathleene Hazel, MD;  Location: [facility] INVASIVE CV LAB;  Service: Cardiovascular;  Laterality: N/A;   TOTAL KNEE ARTHROPLASTY Left 12/17/2014   Procedure: LEFT TOTAL KNEE ARTHROPLASTY;  Surgeon: Kathryne Hitch, MD;  Location[facility];  Service: Orthopedics;  Laterality: Left;    No current facility-administered medications for this encounter.   Current Outpatient Medications  Medication Sig Dispense Refill Last Dose/Taking   albuterol (VENTOLIN HFA) 108 (90 Base) MCG/ACT inhaler Inhale 2 puffs into the lungs every 6 (six) hours as needed for wheezing or shortness of breath. 6.7 g 1 Taking As Needed   atorvastatin (LIPITOR) 80 MG tablet Take one (1) tablet by mouth ( 80 mg) every day at 6 pm. 90 tablet 3 Taking   clopidogrel (PLAVIX) 75 MG tablet TAKE 1 TABLET(75 MG) BY MOUTH DAILY 90 tablet 3 Taking   fluticasone (FLONASE) 50 MCG/ACT nasal spray Place 2 sprays into both nostrils daily as needed for allergies.   Taking As Needed   lisinopril (ZESTRIL) 40 MG  tablet Take 1 tablet (40 mg total) by mouth daily. 90 tablet 3 Taking   amLODipine (NORVASC) 10 MG tablet TAKE 1 TABLET(10 MG) BY MOUTH DAILY 90 tablet 3    benzonatate (TESSALON) 100 MG capsule Take 1 capsule (100 mg total) by mouth 3 (three) times daily as needed. 30 capsule 0 Not Taking   ezetimibe (ZETIA) 10 MG tablet Take 1 tablet (10 mg total) by mouth daily. 90 tablet 3     hydrochlorothiazide (HYDRODIURIL) 12.5 MG tablet Take 1 tablet (12.5 mg total) by mouth daily. 90 tablet 3    nitroGLYCERIN (NITROSTAT) 0.4 MG SL tablet Place 1 tablet (0.4 mg total) under the tongue every 5 (five) minutes as needed for chest pain. 25 tablet 3    Allergies  Allergen Reactions   Metoprolol Other (See Comments)    Pt trialed on metoprolol and carvedilol - bradycardic with HR 40-50 on both beta blockers.    Social History   Tobacco Use   Smoking status: Never   Smokeless tobacco: Current    Types: Snuff  Substance Use Topics   Alcohol use: Yes    Alcohol/week: 0.0 standard drinks of alcohol    Comment: 2-3 a month    Family History  Problem Relation Age of Onset   CAD Mother        Mom had heart problems in her 49s   Heart attack Father    Stroke Father    Hypertension Father      Review of Systems  Objective:  Physical Exam Vitals reviewed.  Constitutional:      Appearance: Normal appearance.  HENT:     Head: Normocephalic and atraumatic.  Eyes:     Extraocular Movements: Extraocular movements intact.     Pupils: Pupils are equal, round, and reactive to light.  Cardiovascular:     Rate and Rhythm: Normal rate.  Pulmonary:     Effort: Pulmonary effort is normal.     Breath sounds: Normal breath sounds.  Abdominal:     Palpations: Abdomen is soft.  Musculoskeletal:     Cervical back: Normal range of motion and neck supple.     Right knee: Decreased range of motion. Tenderness present over the medial joint line and lateral joint line. Abnormal alignment.  Neurological:     Mental Status: He is alert and oriented to person, place, and time.  Psychiatric:        Behavior: Behavior normal.     Vital signs in last 24 hours:    Labs:   Estimated body mass index is 36.43 kg/m as calculated from the following:   Height as of 11/28/23: 5\' 7"  (1.702 m).   Weight as of 11/28/23: 105.5 kg.   Imaging Review Plain radiographs demonstrate severe  degenerative joint disease of the right knee(s). The overall alignment ismild varus. The bone quality appears to be excellent for age and reported activity level.      Assessment/Plan:  End stage arthritis, right knee   The patient history, physical examination, clinical judgment of the provider and imaging studies are consistent with end stage degenerative joint disease of the right knee(s) and total knee arthroplasty is deemed medically necessary. The treatment options including medical management, injection therapy arthroscopy and arthroplasty were discussed at length. The risks and benefits of total knee arthroplasty were presented and reviewed. The risks due to aseptic loosening, infection, stiffness, patella tracking problems, thromboembolic complications and other imponderables were discussed. The patient acknowledged the explanation, agreed to proceed  with the plan and consent was signed. Patient is being admitted for inpatient treatment for surgery, pain control, PT, OT, prophylactic antibiotics, VTE prophylaxis, progressive ambulation and ADL's and discharge planning. The patient is planning to be discharged home with home health services

## 2023-12-06 ENCOUNTER — Observation Stay (HOSPITAL_COMMUNITY)
Admission: RE | Admit: 2023-12-06 | Discharge: 2023-12-08 | Disposition: A | Discharge status: Home Health | Payer: Medicare Other | Attending: Orthopaedic Surgery | Admitting: Orthopaedic Surgery

## 2023-12-06 ENCOUNTER — Other Ambulatory Visit: Payer: Self-pay

## 2023-12-06 ENCOUNTER — Ambulatory Visit (HOSPITAL_BASED_OUTPATIENT_CLINIC_OR_DEPARTMENT_OTHER): Payer: Self-pay | Admitting: Registered Nurse

## 2023-12-06 ENCOUNTER — Ambulatory Visit (HOSPITAL_COMMUNITY): Payer: Self-pay | Admitting: Medical

## 2023-12-06 ENCOUNTER — Encounter (HOSPITAL_COMMUNITY): Payer: Self-pay | Admitting: Orthopaedic Surgery

## 2023-12-06 ENCOUNTER — Encounter (HOSPITAL_COMMUNITY)
Admission: RE | Disposition: A | Discharge status: Home Health | Payer: Self-pay | Source: Home / Self Care | Attending: Orthopaedic Surgery

## 2023-12-06 ENCOUNTER — Observation Stay (HOSPITAL_COMMUNITY)

## 2023-12-06 DIAGNOSIS — M1711 Unilateral primary osteoarthritis, right knee: Secondary | ICD-10-CM | POA: Diagnosis present

## 2023-12-06 DIAGNOSIS — Z96651 Presence of right artificial knee joint: Secondary | ICD-10-CM

## 2023-12-06 DIAGNOSIS — Z7902 Long term (current) use of antithrombotics/antiplatelets: Secondary | ICD-10-CM | POA: Insufficient documentation

## 2023-12-06 DIAGNOSIS — E785 Hyperlipidemia, unspecified: Secondary | ICD-10-CM

## 2023-12-06 DIAGNOSIS — Z79899 Other long term (current) drug therapy: Secondary | ICD-10-CM | POA: Diagnosis not present

## 2023-12-06 DIAGNOSIS — I2510 Atherosclerotic heart disease of native coronary artery without angina pectoris: Secondary | ICD-10-CM | POA: Insufficient documentation

## 2023-12-06 DIAGNOSIS — I10 Essential (primary) hypertension: Secondary | ICD-10-CM

## 2023-12-06 DIAGNOSIS — Z96652 Presence of left artificial knee joint: Secondary | ICD-10-CM | POA: Diagnosis not present

## 2023-12-06 HISTORY — PX: TOTAL KNEE ARTHROPLASTY: SHX125

## 2023-12-06 LAB — BASIC METABOLIC PANEL
Anion gap: 8 (ref 5–15)
BUN: 28 mg/dL — ABNORMAL HIGH (ref 8–23)
CO2: 22 mmol/L (ref 22–32)
Calcium: 8.3 mg/dL — ABNORMAL LOW (ref 8.9–10.3)
Chloride: 108 mmol/L (ref 98–111)
Creatinine, Ser: 1.26 mg/dL — ABNORMAL HIGH (ref 0.61–1.24)
GFR, Estimated: >60 mL/min (ref >60)
Glucose, Bld: 110 mg/dL — ABNORMAL HIGH (ref 70–99)
Potassium: 4.5 mmol/L (ref 3.5–5.1)
Sodium: 138 mmol/L (ref 135–145)

## 2023-12-06 SURGERY — ARTHROPLASTY, KNEE, TOTAL
Anesthesia: Regional | Site: Knee | Laterality: Right

## 2023-12-06 MED ORDER — OXYCODONE HCL 5 MG PO TABS
10.0000 mg | ORAL_TABLET | ORAL | Status: DC | PRN
Start: 2023-12-06 — End: 2023-12-08
  Administered 2023-12-06 (×2): 15 mg via ORAL
  Filled 2023-12-06 (×2): qty 3

## 2023-12-06 MED ORDER — METOCLOPRAMIDE HCL 5 MG/ML IJ SOLN
5.0000 mg | Freq: Three times a day (TID) | INTRAMUSCULAR | Status: DC | PRN
  Administered 2023-12-06: 10 mg via INTRAVENOUS
  Filled 2023-12-06: qty 2

## 2023-12-06 MED ORDER — OXYCODONE HCL 5 MG PO TABS: 5.0000 mg | ORAL_TABLET | Freq: Once | ORAL | Status: DC | PRN

## 2023-12-06 MED ORDER — MIDAZOLAM HCL 2 MG/2ML IJ SOLN
INTRAMUSCULAR | Status: AC
  Filled 2023-12-06: qty 2

## 2023-12-06 MED ORDER — PANTOPRAZOLE SODIUM 40 MG PO TBEC
40.0000 mg | DELAYED_RELEASE_TABLET | Freq: Every day | ORAL | Status: DC
  Administered 2023-12-07 – 2023-12-08 (×2): 40 mg via ORAL
  Filled 2023-12-06 (×2): qty 1

## 2023-12-06 MED ORDER — METHOCARBAMOL 500 MG PO TABS
500.0000 mg | ORAL_TABLET | Freq: Four times a day (QID) | ORAL | Status: DC | PRN
  Administered 2023-12-06: 500 mg via ORAL
  Filled 2023-12-06: qty 1

## 2023-12-06 MED ORDER — PROPOFOL 500 MG/50ML IV EMUL
INTRAVENOUS | Status: DC | PRN
  Administered 2023-12-06: 40 ug/kg/min via INTRAVENOUS

## 2023-12-06 MED ORDER — ALBUMIN HUMAN 5 % IV SOLN
INTRAVENOUS | Status: AC
  Administered 2023-12-06: 12.5 g via INTRAVENOUS
  Filled 2023-12-06: qty 250

## 2023-12-06 MED ORDER — BUPIVACAINE-EPINEPHRINE (PF) 0.25% -1:200000 IJ SOLN
INTRAMUSCULAR | Status: AC
  Filled 2023-12-06: qty 30

## 2023-12-06 MED ORDER — PHENOL 1.4 % MT LIQD: 1.0000 | OROMUCOSAL | Status: DC | PRN

## 2023-12-06 MED ORDER — HYDROCHLOROTHIAZIDE 12.5 MG PO TABS
12.5000 mg | ORAL_TABLET | Freq: Every day | ORAL | Status: DC
  Administered 2023-12-07 – 2023-12-08 (×2): 12.5 mg via ORAL
  Filled 2023-12-06 (×2): qty 1

## 2023-12-06 MED ORDER — ALBUTEROL SULFATE (2.5 MG/3ML) 0.083% IN NEBU
2.5000 mg | INHALATION_SOLUTION | Freq: Four times a day (QID) | RESPIRATORY_TRACT | Status: DC | PRN
Start: 2023-12-06 — End: 2023-12-08

## 2023-12-06 MED ORDER — METOCLOPRAMIDE HCL 5 MG PO TABS: 5.0000 mg | ORAL_TABLET | Freq: Three times a day (TID) | ORAL | Status: DC | PRN

## 2023-12-06 MED ORDER — EPHEDRINE 5 MG/ML INJ
INTRAVENOUS | Status: AC
  Filled 2023-12-06: qty 5

## 2023-12-06 MED ORDER — ASPIRIN 81 MG PO CHEW
81.0000 mg | CHEWABLE_TABLET | Freq: Every day | ORAL | Status: DC
  Administered 2023-12-07 – 2023-12-08 (×2): 81 mg via ORAL
  Filled 2023-12-06 (×2): qty 1

## 2023-12-06 MED ORDER — 0.9 % SODIUM CHLORIDE (POUR BTL) OPTIME
TOPICAL | Status: DC | PRN
  Administered 2023-12-06: 1000 mL

## 2023-12-06 MED ORDER — ONDANSETRON HCL 4 MG/2ML IJ SOLN
4.0000 mg | Freq: Four times a day (QID) | INTRAMUSCULAR | Status: DC | PRN
  Administered 2023-12-06: 4 mg via INTRAVENOUS
  Filled 2023-12-06: qty 2

## 2023-12-06 MED ORDER — CLOPIDOGREL BISULFATE 75 MG PO TABS
75.0000 mg | ORAL_TABLET | Freq: Every day | ORAL | Status: DC
  Administered 2023-12-07 – 2023-12-08 (×2): 75 mg via ORAL
  Filled 2023-12-06 (×2): qty 1

## 2023-12-06 MED ORDER — BUPIVACAINE-EPINEPHRINE 0.25% -1:200000 IJ SOLN
INTRAMUSCULAR | Status: DC | PRN
  Administered 2023-12-06: 30 mL

## 2023-12-06 MED ORDER — SODIUM CHLORIDE 0.9 % IR SOLN
Status: DC | PRN
  Administered 2023-12-06: 1000 mL

## 2023-12-06 MED ORDER — MIDAZOLAM HCL 2 MG/2ML IJ SOLN
1.0000 mg | INTRAMUSCULAR | Status: DC
  Administered 2023-12-06: 2 mg via INTRAVENOUS

## 2023-12-06 MED ORDER — POLYETHYLENE GLYCOL 3350 17 G PO PACK: 17.0000 g | PACK | Freq: Every day | ORAL | Status: DC | PRN

## 2023-12-06 MED ORDER — LACTATED RINGERS IV SOLN: INTRAVENOUS | Status: DC

## 2023-12-06 MED ORDER — CEFAZOLIN SODIUM-DEXTROSE 2-4 GM/100ML-% IV SOLN
2.0000 g | INTRAVENOUS | Status: AC
  Administered 2023-12-06: 2 g via INTRAVENOUS
  Filled 2023-12-06: qty 100

## 2023-12-06 MED ORDER — HYDROMORPHONE HCL 1 MG/ML IJ SOLN: 0.2500 mg | INTRAMUSCULAR | Status: DC | PRN

## 2023-12-06 MED ORDER — DEXAMETHASONE SODIUM PHOSPHATE 10 MG/ML IJ SOLN
INTRAMUSCULAR | Status: DC | PRN
  Administered 2023-12-06: 8 mg via INTRAVENOUS

## 2023-12-06 MED ORDER — FENTANYL CITRATE PF 50 MCG/ML IJ SOSY
PREFILLED_SYRINGE | INTRAMUSCULAR | Status: AC
  Filled 2023-12-06: qty 2

## 2023-12-06 MED ORDER — DOCUSATE SODIUM 100 MG PO CAPS
100.0000 mg | ORAL_CAPSULE | Freq: Two times a day (BID) | ORAL | Status: DC
  Administered 2023-12-06 – 2023-12-08 (×4): 100 mg via ORAL
  Filled 2023-12-06 (×3): qty 1

## 2023-12-06 MED ORDER — OXYCODONE HCL 5 MG/5ML PO SOLN: 5.0000 mg | Freq: Once | ORAL | Status: DC | PRN

## 2023-12-06 MED ORDER — CHLORHEXIDINE GLUCONATE 0.12 % MT SOLN
15.0000 mL | Freq: Once | OROMUCOSAL | Status: AC
  Administered 2023-12-06: 15 mL via OROMUCOSAL

## 2023-12-06 MED ORDER — OXYCODONE HCL 5 MG PO TABS
ORAL_TABLET | ORAL | Status: AC
  Administered 2023-12-06: 10 mg via ORAL
  Filled 2023-12-06: qty 2

## 2023-12-06 MED ORDER — METHOCARBAMOL 500 MG PO TABS
ORAL_TABLET | ORAL | Status: AC
Start: 2023-12-06 — End: 2023-12-06
  Administered 2023-12-06: 500 mg via ORAL
  Filled 2023-12-06: qty 1

## 2023-12-06 MED ORDER — EZETIMIBE 10 MG PO TABS
10.0000 mg | ORAL_TABLET | Freq: Every day | ORAL | Status: DC
  Administered 2023-12-06 – 2023-12-07 (×2): 10 mg via ORAL
  Filled 2023-12-06 (×2): qty 1

## 2023-12-06 MED ORDER — LISINOPRIL 20 MG PO TABS
40.0000 mg | ORAL_TABLET | Freq: Every day | ORAL | Status: DC
  Administered 2023-12-07 – 2023-12-08 (×2): 40 mg via ORAL
  Filled 2023-12-06 (×2): qty 2

## 2023-12-06 MED ORDER — FENTANYL CITRATE PF 50 MCG/ML IJ SOSY
50.0000 ug | PREFILLED_SYRINGE | INTRAMUSCULAR | Status: DC
  Administered 2023-12-06: 50 ug via INTRAVENOUS

## 2023-12-06 MED ORDER — PHENYLEPHRINE HCL-NACL 20-0.9 MG/250ML-% IV SOLN
INTRAVENOUS | Status: DC | PRN
  Administered 2023-12-06: 25 ug/min via INTRAVENOUS

## 2023-12-06 MED ORDER — ONDANSETRON HCL 4 MG/2ML IJ SOLN
INTRAMUSCULAR | Status: AC
  Filled 2023-12-06: qty 2

## 2023-12-06 MED ORDER — METHOCARBAMOL 1000 MG/10ML IJ SOLN: 500.0000 mg | Freq: Four times a day (QID) | INTRAMUSCULAR | Status: DC | PRN

## 2023-12-06 MED ORDER — SODIUM CHLORIDE 0.9 % IV SOLN
INTRAVENOUS | Status: AC
Start: 2023-12-06 — End: 2023-12-07

## 2023-12-06 MED ORDER — ACETAMINOPHEN 10 MG/ML IV SOLN: 1000.0000 mg | Freq: Once | INTRAVENOUS | Status: DC | PRN

## 2023-12-06 MED ORDER — BUPIVACAINE IN DEXTROSE 0.75-8.25 % IT SOLN
INTRATHECAL | Status: DC | PRN
  Administered 2023-12-06: 12 mg via INTRATHECAL

## 2023-12-06 MED ORDER — ONDANSETRON HCL 4 MG PO TABS: 4.0000 mg | ORAL_TABLET | Freq: Four times a day (QID) | ORAL | Status: DC | PRN

## 2023-12-06 MED ORDER — PROPOFOL 1000 MG/100ML IV EMUL
INTRAVENOUS | Status: AC
  Filled 2023-12-06: qty 100

## 2023-12-06 MED ORDER — ONDANSETRON HCL 4 MG/2ML IJ SOLN: 4.0000 mg | Freq: Once | INTRAMUSCULAR | Status: DC | PRN

## 2023-12-06 MED ORDER — DIPHENHYDRAMINE HCL 12.5 MG/5ML PO ELIX: 12.5000 mg | ORAL_SOLUTION | ORAL | Status: DC | PRN

## 2023-12-06 MED ORDER — OXYCODONE HCL 5 MG PO TABS
5.0000 mg | ORAL_TABLET | ORAL | Status: DC | PRN
  Administered 2023-12-07 – 2023-12-08 (×6): 10 mg via ORAL
  Filled 2023-12-06 (×6): qty 2

## 2023-12-06 MED ORDER — TRANEXAMIC ACID-NACL 1000-0.7 MG/100ML-% IV SOLN
1000.0000 mg | INTRAVENOUS | Status: AC
Start: 2023-12-06 — End: 2023-12-06
  Administered 2023-12-06: 1000 mg via INTRAVENOUS
  Filled 2023-12-06: qty 100

## 2023-12-06 MED ORDER — HYDROMORPHONE HCL 1 MG/ML IJ SOLN
0.5000 mg | INTRAMUSCULAR | Status: DC | PRN
  Administered 2023-12-06 (×3): 1 mg via INTRAVENOUS
  Filled 2023-12-06 (×3): qty 1

## 2023-12-06 MED ORDER — ACETAMINOPHEN 10 MG/ML IV SOLN
INTRAVENOUS | Status: AC
  Administered 2023-12-06: 1000 mg via INTRAVENOUS
  Filled 2023-12-06: qty 100

## 2023-12-06 MED ORDER — ORAL CARE MOUTH RINSE: 15.0000 mL | OROMUCOSAL | Status: DC | PRN

## 2023-12-06 MED ORDER — AMISULPRIDE (ANTIEMETIC) 5 MG/2ML IV SOLN: 10.0000 mg | Freq: Once | INTRAVENOUS | Status: DC | PRN

## 2023-12-06 MED ORDER — DEXAMETHASONE SODIUM PHOSPHATE 10 MG/ML IJ SOLN
INTRAMUSCULAR | Status: AC
  Filled 2023-12-06: qty 1

## 2023-12-06 MED ORDER — ALUM & MAG HYDROXIDE-SIMETH 200-200-20 MG/5ML PO SUSP: 30.0000 mL | ORAL | Status: DC | PRN

## 2023-12-06 MED ORDER — EPHEDRINE SULFATE-NACL 50-0.9 MG/10ML-% IV SOSY
PREFILLED_SYRINGE | INTRAVENOUS | Status: DC | PRN
  Administered 2023-12-06 (×3): 5 mg via INTRAVENOUS

## 2023-12-06 MED ORDER — POVIDONE-IODINE 10 % EX SWAB: 2.0000 | Freq: Once | CUTANEOUS | Status: DC

## 2023-12-06 MED ORDER — ACETAMINOPHEN 325 MG PO TABS: 325.0000 mg | ORAL_TABLET | Freq: Four times a day (QID) | ORAL | Status: DC | PRN

## 2023-12-06 MED ORDER — ORAL CARE MOUTH RINSE: 15.0000 mL | Freq: Once | OROMUCOSAL | Status: AC

## 2023-12-06 MED ORDER — AMLODIPINE BESYLATE 10 MG PO TABS
10.0000 mg | ORAL_TABLET | Freq: Every day | ORAL | Status: DC
  Administered 2023-12-07 – 2023-12-08 (×2): 10 mg via ORAL
  Filled 2023-12-06 (×2): qty 1

## 2023-12-06 MED ORDER — ONDANSETRON HCL 4 MG/2ML IJ SOLN
INTRAMUSCULAR | Status: DC | PRN
  Administered 2023-12-06: 4 mg via INTRAVENOUS

## 2023-12-06 MED ORDER — MENTHOL 3 MG MT LOZG: 1.0000 | LOZENGE | OROMUCOSAL | Status: DC | PRN

## 2023-12-06 MED ORDER — ALBUMIN HUMAN 5 % IV SOLN: 12.5000 g | Freq: Once | INTRAVENOUS | Status: AC

## 2023-12-06 SURGICAL SUPPLY — 55 items
BAG COUNTER SPONGE SURGICOUNT (BAG) IMPLANT
BAG ZIPLOCK 12X15 (MISCELLANEOUS) ×1 IMPLANT
BENZOIN TINCTURE PRP APPL 2/3 (GAUZE/BANDAGES/DRESSINGS) IMPLANT
BLADE SAG 18X100X1.27 (BLADE) ×1 IMPLANT
BLADE SURG SZ10 CARB STEEL (BLADE) IMPLANT
BNDG ELASTIC 6INX 5YD STR LF (GAUZE/BANDAGES/DRESSINGS) ×2 IMPLANT
BOWL SMART MIX CTS (DISPOSABLE) IMPLANT
CEMENT BONE R 1X40 (Cement) IMPLANT
COMP PATELLA PEG 3 32 (Joint) ×1 IMPLANT
COMPONENT PATELLA PEG 3 32 (Joint) IMPLANT
COOLER ICEMAN CLASSIC (MISCELLANEOUS) ×1 IMPLANT
COVER SURGICAL LIGHT HANDLE (MISCELLANEOUS) ×1 IMPLANT
CUFF TRNQT CYL 34X4.125X (TOURNIQUET CUFF) ×1 IMPLANT
DRAPE INCISE IOBAN 66X45 STRL (DRAPES) ×1 IMPLANT
DRAPE U-SHAPE 47X51 STRL (DRAPES) ×1 IMPLANT
DURAPREP 26ML APPLICATOR (WOUND CARE) ×1 IMPLANT
ELECT BLADE TIP CTD 4 INCH (ELECTRODE) ×1 IMPLANT
ELECT REM PT RETURN 15FT ADLT (MISCELLANEOUS) ×1 IMPLANT
GAUZE PAD ABD 8X10 STRL (GAUZE/BANDAGES/DRESSINGS) ×2 IMPLANT
GAUZE SPONGE 4X4 12PLY STRL (GAUZE/BANDAGES/DRESSINGS) ×1 IMPLANT
GAUZE XEROFORM 1X8 LF (GAUZE/BANDAGES/DRESSINGS) IMPLANT
GLOVE BIO SURGEON STRL SZ7.5 (GLOVE) ×1 IMPLANT
GLOVE BIOGEL PI IND STRL 8 (GLOVE) ×2 IMPLANT
GLOVE ECLIPSE 8.0 STRL XLNG CF (GLOVE) ×1 IMPLANT
GOWN STRL REUS W/ TWL XL LVL3 (GOWN DISPOSABLE) ×2 IMPLANT
HOLDER FOLEY CATH W/STRAP (MISCELLANEOUS) IMPLANT
IMMOBILIZER KNEE 20 (SOFTGOODS) ×1 IMPLANT
IMMOBILIZER KNEE 20 THIGH 36 (SOFTGOODS) ×1 IMPLANT
INSERT TIB ARTISURF SZ8-11X12 (Insert) IMPLANT
KIT TURNOVER KIT A (KITS) IMPLANT
MANIFOLD NEPTUNE II (INSTRUMENTS) ×1 IMPLANT
NS IRRIG 1000ML POUR BTL (IV SOLUTION) ×1 IMPLANT
PACK TOTAL KNEE CUSTOM (KITS) ×1 IMPLANT
PAD COLD SHLDR WRAP-ON (PAD) ×1 IMPLANT
PADDING CAST COTTON 6X4 STRL (CAST SUPPLIES) ×2 IMPLANT
PIN DRILL HDLS TROCAR 75 4PK (PIN) IMPLANT
PROS FEM KNEE PS STD 9 RT (Joint) ×1 IMPLANT
PROS TIB KNEE PS 0D F RT (Joint) ×1 IMPLANT
PROSTHESIS FEM KNEE PS STD9 RT (Joint) IMPLANT
PROSTHESIS TIB KNEE PS 0D F RT (Joint) IMPLANT
PROTECTOR NERVE ULNAR (MISCELLANEOUS) ×1 IMPLANT
SCREW FEMALE HEX FIX 25X2.5 (ORTHOPEDIC DISPOSABLE SUPPLIES) IMPLANT
SET HNDPC FAN SPRY TIP SCT (DISPOSABLE) ×1 IMPLANT
SET PAD KNEE POSITIONER (MISCELLANEOUS) ×1 IMPLANT
SPIKE FLUID TRANSFER (MISCELLANEOUS) IMPLANT
STAPLER SKIN PROX WIDE 3.9 (STAPLE) IMPLANT
STRIP CLOSURE SKIN 1/2X4 (GAUZE/BANDAGES/DRESSINGS) IMPLANT
SUT MNCRL AB 4-0 PS2 18 (SUTURE) IMPLANT
SUT VIC AB 0 CT1 27XBRD ANTBC (SUTURE) ×1 IMPLANT
SUT VIC AB 1 CT1 36 (SUTURE) ×2 IMPLANT
SUT VIC AB 2-0 CT1 TAPERPNT 27 (SUTURE) ×2 IMPLANT
TOWEL GREEN STERILE FF (TOWEL DISPOSABLE) ×1 IMPLANT
TRAY FOLEY MTR SLVR 16FR STAT (SET/KITS/TRAYS/PACK) IMPLANT
WATER STERILE IRR 1000ML POUR (IV SOLUTION) ×2 IMPLANT
YANKAUER SUCT BULB TIP NO VENT (SUCTIONS) ×1 IMPLANT

## 2023-12-06 NOTE — Transfer of Care (Signed)
 Immediate Anesthesia Transfer of Care Note  Patient: Donald Wood  Procedure(s) Performed: RIGHT TOTAL KNEE ARTHROPLASTY (Right: Knee)  Patient Location: PACU  Anesthesia Type:MAC and Spinal  Level of Consciousness: awake, alert , oriented, and patient cooperative  Airway & Oxygen Therapy: Patient Spontanous Breathing and Patient connected to face mask oxygen  Post-op Assessment: Report given to RN and Post -op Vital signs reviewed and stable  Post vital signs: Reviewed and stable  Last Vitals:  Vitals Value Taken Time  BP 82/31 12/06/23 1005  Temp    Pulse 50 12/06/23 1007  Resp 21 12/06/23 1007  SpO2 91 % 12/06/23 1007  Vitals shown include unfiled device data.  Last Pain:  Vitals:   12/06/23 0805  TempSrc:   PainSc: 0-No pain      Patients Stated Pain Goal: 5 (12/06/23 0631)  Complications: No notable events documented.

## 2023-12-06 NOTE — Op Note (Signed)
 Operative Note  Date of operation: 12/06/2023 Preoperative diagnosis: Right knee primary osteoarthritis Postoperative diagnosis: Same  Procedure: Right foot total knee arthroplasty  Implants: Biomet/Zimmer persona press-fit knee system Implant Name Type Inv. Item Serial No. Manufacturer Lot No. LRB No. Used Action  COMP PATELLA PEG 3 32 - [id number] Joint COMP PATELLA PEG 3 32  ZIMMER [employer]ON(ORTH,TRAU,BIO,SG) [id number] Right 1 Implanted  PROS TIB KNEE PS 0D F RT - [id number] Joint PROS TIB KNEE PS 0D F RT  ZIMMER RECON(ORTH,TRAU,BIO,SG) [id number] Right 1 Implanted  PROS FEM KNEE PS STD 9 RT - [id number] Joint PROS FEM KNEE PS STD 9 RT  ZIMMER RECON(ORTH,TRAU,BIO,SG) [id number] Right 1 Implanted  INSERT TIB ARTISURF [id number] - [id number] Insert INSERT TIB ARTISURF [id number]  ZIMMER RECON(ORTH,TRAU,BIO,SG) [id number] Right 1 Implanted   Surgeon: Vanita Panda. Magnus Ivan, MD Assistant: Rexene Edison, PA-C  Anesthesia: #1 right lower extremity adductor canal block, #2 spinal, #3 local Antibiotics: IV Ancef Tourniquet time: Under 1 hour EBL: Less than 50 cc Complications: None  Indications: The patient is a 69 year old active gentleman with debilitating arthritis involving his right knee.  This has been well-documented clinical exam and x-ray findings.  He has a remote history of a left total knee arthroplasty that is done well.  At this point his right knee pain is daily and it is detrimentally affecting his mobility, his quality of life and his actives daily living to the point he wishes to proceed with a right total knee arthroplasty.  He has tried and failed conservative treatment for over a year now.  Having had this before in the left side he is fully aware of the risk of acute blood loss anemia, nerve or vessel injury, fracture, infection, DVT, implant failure and wound healing issues.  He understands that our goals are hopefully decrease pain, improved mobility and improved quality of  life.  Procedure description: After informed consent was obtained and the appropriate right knee was marked, anesthesia obtained a right lower extremity adductor canal block in the holding room.  The patient was then brought to the operating room and set up on the operating table where spinal anesthesia was obtained.  He was laid in supine position on the operating table and a Foley catheter was placed.  A nonsterile tractors placed around his upper right thigh and his right thigh, knee, leg and ankle were prepped and draped in DuraPrep and sterile drapes including a sterile stockinette.  A timeout was called and he was identified as the correct patient the correct right knee.  An Esmarch was then used to wrap of the leg and the tourniquet was inflated to 300 mm pressure.  With the knee extended a direct midline incision was made over the patella and carried proximally distally.  Dissection was carried down to the knee joint and a medial parapatellar arthrotomy is made finding a moderate joint effusion.  With the knee in a flexed position we used the extramedullary cutting guide for making her proximal tibia cut correction for varus and valgus and a 7 degree slope.  We made this cut without difficulty taking 2 mm off the low side and that we did back down to more millimeters.  We then used a intramedullary cutting guide for making our distal femur cut setting this for a right knee at 5 degrees externally rotated for 10 mm distal femoral cut.  We then brought the knee back down to full extension and he slightly hyperextended with a 10 mm block.  We then  impacted the femur and put a femoral sizing guide based off the epicondylar axis.  Based off of this we chose a size 9 femur.  We put a 4-in-1 cutting block for a size 9 femur and made our anterior and posterior cuts followed our chamfer cuts.  We then backed the tibia and chose a size F right tibial tray for coverage over the tibial plateau setting the rotation of  the tibial tubercle and the femur.  We did our drill hole and keel punch over this and we felt like we had excellent quality bone for press-fit implants.  We then trialed our size F right femur combined with our size 9 right CR standard femur.  We trialed up to a 12 mm thickness medial congruent right polythene insert.  We replaced the range of motion and stability that insert.  We made a patella cut and drilled 3 holes for a press-fit size 32 patella button.  Again with all trial instrumentation the knee we are pleased with range of motion and stability.  We then removed all transportation with the knee and irrigated knee with normal saline solution.  We then placed Marcaine with epinephrine around the arthrotomy.  Next with the knee in a flexed position and dried real well we placed our Biomet/Zimmer persona press-fit tibial tray for right knee size F followed by press fitting our size 9 right CR standard femur.  We placed our 12 mm thickness right medial congruent polythene insert and press-fit our size 32 patella button.  Again we are pleased the range of motion and stability once we got all instrumentation in place.  We then let the tourniquet down and hemostasis was obtained with electrocautery.  The arthrotomy was closed with interrupted #1 Vicryl suture followed by 0 Vicryl goes deep tissue and 2-0 Vicryl close subcutaneous tissue.  The staples were used to close the skin.  Well-padded sterile dressings applied.  The patient was taken the recovery room in stable condition.  Rexene Edison, PA-C did assist during entire case and beginning and his assistance was back to necessary and crucial for soft tissue management and retraction, helping guide implant placement and a layered closure of the wound.

## 2023-12-06 NOTE — Interval H&P Note (Signed)
 History and Physical Interval Note: Patient understands that he is here today for right total knee replacement to treat his significant right knee pain and arthritis.  There is been no acute or interval change in his medical status.  The risks and benefits of surgery have been discussed in detail and informed consent has been obtained.  The right operative knee has been marked.  12/06/2023 7:07 AM  Donald Wood  has presented today for surgery, with the diagnosis of OSTEOARTHRITIS RIGHT KNEE.  The various methods of treatment have been discussed with the patient and family. After consideration of risks, benefits and other options for treatment, the patient has consented to  Procedure(s): RIGHT TOTAL KNEE ARTHROPLASTY (Right) as a surgical intervention.  The patient's history has been reviewed, patient examined, no change in status, stable for surgery.  I have reviewed the patient's chart and labs.  Questions were answered to the patient's satisfaction.     Kathryne Hitch

## 2023-12-06 NOTE — Anesthesia Procedure Notes (Signed)
 Procedure Name: MAC Date/Time: 12/06/2023 8:20 AM  Performed by: Elisabeth Cara, CRNAPre-anesthesia Checklist: Patient identified, Emergency Drugs available, Suction available, Patient being monitored and Timeout performed Patient Re-evaluated:Patient Re-evaluated prior to induction Oxygen Delivery Method: Simple face mask Placement Confirmation: positive ETCO2 Dental Injury: Teeth and Oropharynx as per pre-operative assessment

## 2023-12-06 NOTE — Anesthesia Procedure Notes (Signed)
 Spinal  Start time: 12/06/2023 8:21 AM End time: 12/06/2023 8:25 AM Reason for block: surgical anesthesia Staffing Performed: resident/CRNA  Resident/CRNA: Elisabeth Cara, CRNA Performed by: Elisabeth Cara, CRNA Authorized by: [name], MD   Preanesthetic Checklist Completed: patient identified, IV checked, site marked, risks and benefits discussed, surgical consent, monitors and equipment checked, pre-op evaluation and timeout performed Spinal Block Patient position: sitting Prep: DuraPrep and site prepped and draped Patient monitoring: blood pressure, continuous pulse ox, cardiac monitor and heart rate Approach: midline Location: L3-4 Injection technique: single-shot Needle Needle type: Pencan  Needle gauge: 24 G Needle length: 10 cm Assessment Sensory level: T4 Events: CSF return Additional Notes Pt placed in sitting position for spinal placement. Spinal kit expiration date checked and verified. Sterile prep and drape of back. Local anesthetic injected to site. One attempt by CRNA. - heme, + clear free flowing CSF obtained with ease. Pt placed supine after spinal placement.

## 2023-12-06 NOTE — Anesthesia Postprocedure Evaluation (Signed)
 Anesthesia Post Note  Patient: Donald Wood  Procedure(s) Performed: RIGHT TOTAL KNEE ARTHROPLASTY (Right: Knee)     Patient location during evaluation: Nursing Unit Anesthesia Type: Regional and Spinal Level of consciousness: oriented and awake and alert Pain management: pain level controlled Vital Signs Assessment: post-procedure vital signs reviewed and stable Respiratory status: spontaneous breathing and respiratory function stable Cardiovascular status: blood pressure returned to baseline and stable Postop Assessment: no headache, no backache, no apparent nausea or vomiting and patient able to bend at knees Anesthetic complications: no  No notable events documented.  Last Vitals:  Vitals:   12/06/23 1312 12/06/23 1506  BP:  111/64  Pulse:  60  Resp:  18  Temp:  (!) 35.6 C  SpO2: 99%     Last Pain:  Vitals:   12/06/23 1517  TempSrc:   PainSc: 7                  [name]

## 2023-12-06 NOTE — Evaluation (Signed)
 Physical Therapy Evaluation Patient Details Name: Donald Wood MRN: [id number] DOB: 12-29-1955 Today's Date: 12/06/2023  History of Present Illness  68 yo male presents to therapy s/p R TKA on 12/06/2023 due to failure of conservative measures. Pt PMH includes but is not limited to: bradycardia, CAD s/p cath, HLD, STEMI, HTN, and L TKA (2016).  Clinical Impression    Donald Wood is a 68 y.o. male  POD 0 s/p R TKA. Patient reports IND with mobility at baseline. Patient is now limited by functional impairments (see PT problem list below) and requires CGA for bed mobility and CGA and cues for transfers. Patient was unable to ambulate at time of eval due to N and V. Patient instructed in exercise to facilitate ROM and circulation to manage edema.  Patient will benefit from continued skilled PT interventions to address impairments and progress towards PLOF. Acute PT will follow to progress mobility and stair training in preparation for safe discharge home with family assist and [facility] services.       If plan is discharge home, recommend the following: A little help with walking and/or transfers;A little help with bathing/dressing/bathroom;Assistance with cooking/housework;Assist for transportation;Help with stairs or ramp for entrance   Can travel by private vehicle        Equipment Recommendations None recommended by PT  Recommendations for Other Services       Functional Status Assessment Patient has had a recent decline in their functional status and demonstrates the ability to make significant improvements in function in a reasonable and predictable amount of time.     Precautions / Restrictions Precautions Precautions: Knee;Fall Restrictions Weight Bearing Restrictions Per Provider Order: No      Mobility  Bed Mobility Overal bed mobility: Needs Assistance Bed Mobility: Supine to Sit     Supine to sit: Contact guard, HOB elevated, Used rails     General bed mobility comments: min  cues    Transfers Overall transfer level: Needs assistance Equipment used: Rolling walker (2 wheels) Transfers: Sit to/from Stand, Bed to chair/wheelchair/BSC Sit to Stand: Contact guard assist Stand pivot transfers: Contact guard assist         General transfer comment: min cues for safety and proper hand placement. once in standing pt reported feelind nauseated and vomited during SPT to recliner, pt indicated he was sleepy and did not feel well.    Ambulation/Gait               General Gait Details: NT due to N and V  Stairs            Wheelchair Mobility     Tilt Bed    Modified Rankin (Stroke Patients Only)       Balance Overall balance assessment: Needs assistance Sitting-balance support: Feet supported Sitting balance-Leahy Scale: Good     Standing balance support: Bilateral upper extremity supported, During functional activity, Reliant on assistive device for balance Standing balance-Leahy Scale: Poor                               Pertinent Vitals/Pain Pain Assessment Pain Assessment: 0-10 Pain Score: 6  Pain Location: R knee and LE Pain Descriptors / Indicators: Aching, Dull, Discomfort, Grimacing, Operative site guarding Pain Intervention(s): Limited activity within patient's tolerance, Premedicated before session, Monitored during session, Repositioned, Ice applied (iceman machine)    Home Living Family/patient expects to be discharged to:: Private residence Living Arrangements: Other relatives;Spouse/significant  other (grandchildren) Available Help at Discharge: Family Type of Home: House Home Access: Stairs to enter Entrance Stairs-Rails: Left;Right;Can reach both Entrance Stairs-Number of Steps: 3   Home Layout: One level Home Equipment: Agricultural consultant (2 wheels);Cane - single point;Crutches      Prior Function Prior Level of Function : Independent/Modified Independent;Driving             Mobility Comments: IND  no AD for all ADLs, self care tasks and IADLs       Extremity/Trunk Assessment        Lower Extremity Assessment Lower Extremity Assessment: RLE deficits/detail RLE Deficits / Details: ankle DF/PF 5/5; SLR < 10 degree lag RLE Sensation: WNL    Cervical / Trunk Assessment Cervical / Trunk Assessment: Normal  Communication   Communication Communication: No apparent difficulties    Cognition Arousal: Alert Behavior During Therapy: WFL for tasks assessed/performed   PT - Cognitive impairments: No apparent impairments                         Following commands: Intact       Cueing       General Comments      Exercises Total Joint Exercises Ankle Circles/Pumps: AROM, Both, 10 reps   Assessment/Plan    PT Assessment Patient needs continued PT services  PT Problem List Decreased strength;Decreased range of motion;Decreased balance;Decreased activity tolerance;Decreased mobility;Decreased coordination;Pain       PT Treatment Interventions DME instruction;Gait training;Stair training;Functional mobility training;Therapeutic activities;Therapeutic exercise;Balance training;Neuromuscular re-education;Patient/family education;Modalities    PT Goals (Current goals can be found in the Care Plan section)  Acute Rehab PT Goals Patient Stated 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 PT Goal Formulation: With patient Time For Goal Achievement: 12/20/23 Potential to Achieve Goals: Good    Frequency 7X/week     Co-evaluation               AM-PAC PT "6 Clicks" Mobility  Outcome Measure Help needed turning from your back to your side while in a flat bed without using bedrails?: A Little Help needed moving from lying on your back to sitting on the side of a flat bed without using bedrails?: A Little Help needed moving to and from a bed to a chair (including a wheelchair)?: A Little Help needed standing up from a chair using your arms  (e.g., wheelchair or bedside chair)?: A Little Help needed to walk in hospital room?: Total Help needed climbing 3-5 steps with a railing? : Total 6 Click Score: 14    End of Session Equipment Utilized During Treatment: Gait belt Activity Tolerance: Patient limited by pain;Other (comment) (N and V) Patient left: in chair;with call bell/phone within reach;with family/visitor present Nurse Communication: Mobility status;Other (comment) (N and V) PT Visit Diagnosis: Unsteadiness on feet (R26.81);Other abnormalities of gait and mobility (R26.89);Muscle weakness (generalized) (M62.81);Pain;Difficulty in walking, not elsewhere classified (R26.2) Pain - Right/Left: Right Pain - part of body: Knee;Leg    Time: 0175-1025 PT Time Calculation (min) (ACUTE ONLY): 27 min   Charges:   PT Evaluation $PT Eval Low Complexity: 1 Low PT Treatments $Therapeutic Activity: 8-22 mins PT General Charges $$ ACUTE PT VISIT: 1 Visit         Johnny Bridge, PT Acute Rehab   Jacqualyn Posey 12/06/2023, 5:02 PM

## 2023-12-07 DIAGNOSIS — M1711 Unilateral primary osteoarthritis, right knee: Secondary | ICD-10-CM | POA: Diagnosis not present

## 2023-12-07 LAB — BASIC METABOLIC PANEL
Anion gap: 9 (ref 5–15)
BUN: 36 mg/dL — ABNORMAL HIGH (ref 8–23)
CO2: 20 mmol/L — ABNORMAL LOW (ref 22–32)
Calcium: 8.6 mg/dL — ABNORMAL LOW (ref 8.9–10.3)
Chloride: 107 mmol/L (ref 98–111)
Creatinine, Ser: 1.46 mg/dL — ABNORMAL HIGH (ref 0.61–1.24)
GFR, Estimated: 52 mL/min — ABNORMAL LOW (ref >60)
Glucose, Bld: 165 mg/dL — ABNORMAL HIGH (ref 70–99)
Potassium: 5.5 mmol/L — ABNORMAL HIGH (ref 3.5–5.1)
Sodium: 136 mmol/L (ref 135–145)

## 2023-12-07 LAB — CBC
HCT: 38.6 % — ABNORMAL LOW (ref 39.0–52.0)
Hemoglobin: 11.6 g/dL — ABNORMAL LOW (ref 13.0–17.0)
MCH: 31.3 pg (ref 26.0–34.0)
MCHC: 30.1 g/dL (ref 30.0–36.0)
MCV: 104 fL — ABNORMAL HIGH (ref 80.0–100.0)
Platelets: 242 10*3/uL (ref 150–400)
RBC: 3.71 MIL/uL — ABNORMAL LOW (ref 4.22–5.81)
RDW: 14.2 % (ref 11.5–15.5)
WBC: 15.1 10*3/uL — ABNORMAL HIGH (ref 4.0–10.5)
nRBC: 0 % (ref 0.0–0.2)

## 2023-12-07 MED ORDER — TIZANIDINE HCL 4 MG PO TABS: 4.0000 mg | ORAL_TABLET | Freq: Four times a day (QID) | ORAL | 0 refills | Status: AC | PRN

## 2023-12-07 MED ORDER — TIZANIDINE HCL 4 MG PO TABS
4.0000 mg | ORAL_TABLET | Freq: Four times a day (QID) | ORAL | Status: DC | PRN
  Administered 2023-12-08: 4 mg via ORAL
  Filled 2023-12-07: qty 1

## 2023-12-07 MED ORDER — OXYCODONE HCL 5 MG PO TABS
5.0000 mg | ORAL_TABLET | ORAL | 0 refills | Status: DC | PRN
Start: 2023-12-07 — End: 2023-12-19

## 2023-12-07 NOTE — Discharge Instructions (Signed)

## 2023-12-07 NOTE — Plan of Care (Signed)
  Problem: Health Behavior/Discharge Planning: Goal: Ability to manage health-related needs will improve Outcome: Progressing   Problem: Clinical Measurements: Goal: Ability to maintain clinical measurements within normal limits will improve Outcome: Progressing Goal: Will remain free from infection Outcome: Progressing Goal: Diagnostic test results will improve Outcome: Progressing Goal: Respiratory complications will improve Outcome: Progressing Goal: Cardiovascular complication will be avoided Outcome: Progressing   Problem: Activity: Goal: Risk for activity intolerance will decrease Outcome: Progressing   Problem: Nutrition: Goal: Adequate nutrition will be maintained Outcome: Progressing   Problem: Coping: Goal: Level of anxiety will decrease Outcome: Progressing   Problem: Elimination: Goal: Will not experience complications related to bowel motility Outcome: Progressing Goal: Will not experience complications related to urinary retention Outcome: Progressing   Problem: Pain Managment: Goal: General experience of comfort will improve and/or be controlled Outcome: Progressing   Problem: Safety: Goal: Ability to remain free from injury will improve Outcome: Progressing   Problem: Skin Integrity: Goal: Risk for impaired skin integrity will decrease Outcome: Progressing   Problem: Education: Goal: Knowledge of the prescribed therapeutic regimen will improve Outcome: Progressing Goal: Individualized Educational Video(s) Outcome: Progressing   Problem: Activity: Goal: Ability to avoid complications of mobility impairment will improve Outcome: Progressing Goal: Range of joint motion will improve Outcome: Progressing   Problem: Clinical Measurements: Goal: Postoperative complications will be avoided or minimized Outcome: Progressing   Problem: Pain Management: Goal: Pain level will decrease with appropriate interventions Outcome: Progressing   Problem:  Skin Integrity: Goal: Will show signs of wound healing Outcome: Progressing

## 2023-12-07 NOTE — Progress Notes (Signed)
 Physical Therapy Treatment Patient Details Name: Donald Wood MRN: [id number] DOB: 12-08-1955 Today's Date: 12/07/2023   History of Present Illness 68 yo male presents to therapy s/p R TKA on 12/06/2023 due to failure of conservative measures. Pt PMH includes but is not limited to: bradycardia, CAD s/p cath, HLD, STEMI, HTN, and L TKA (2016).    PT Comments  Pt agreeable to working with therapy. He reports feeling better on today-less nausea. Pain controlled per pt report. Will continue to progress activity as tolerated.    If plan is discharge home, recommend the following: A little help with walking and/or transfers;A little help with bathing/dressing/bathroom;Assistance with cooking/housework;Assist for transportation;Help with stairs or ramp for entrance   Can travel by private vehicle        Equipment Recommendations  None recommended by PT    Recommendations for Other Services       Precautions / Restrictions Precautions Precautions: Fall;Knee Restrictions Weight Bearing Restrictions Per Provider Order: No Other Position/Activity Restrictions: WBAT     Mobility  Bed Mobility Overal bed mobility: Needs Assistance Bed Mobility: Supine to Sit, Sit to Supine     Supine to sit: Supervision, HOB elevated Sit to supine: Supervision, HOB elevated        Transfers Overall transfer level: Needs assistance Equipment used: Rolling walker (2 wheels) Transfers: Sit to/from Stand Sit to Stand: Supervision           General transfer comment: Cues for safety, hand placement.    Ambulation/Gait Ambulation/Gait assistance: Supervision Gait Distance (Feet): 250 Feet Assistive device: Rolling walker (2 wheels) Gait Pattern/deviations: Step-through pattern, Decreased stride length       General Gait Details: No LOB. Pt tolerated distance well.   Stairs             Wheelchair Mobility     Tilt Bed    Modified Rankin (Stroke Patients Only)       Balance  Overall balance assessment: Needs assistance         Standing balance support: During functional activity, Reliant on assistive device for balance Standing balance-Leahy Scale: Fair                              Hotel manager: No apparent difficulties  Cognition Arousal: Alert Behavior During Therapy: WFL for tasks assessed/performed   PT - Cognitive impairments: No apparent impairments                         Following commands: Intact      Cueing    Exercises Total Joint Exercises Ankle Circles/Pumps: AROM, Both, 10 reps Quad Sets: AROM, Right, 10 reps Straight Leg Raises: AROM, Right, 10 reps    General Comments        Pertinent Vitals/Pain Pain Assessment Pain Assessment: 0-10 Pain Score: 5  Pain Location: R knee Pain Descriptors / Indicators: Aching, Discomfort Pain Intervention(s): Monitored during session, Repositioned, Ice applied    Home Living                          Prior Function            PT Goals (current goals can now be found in the care plan section) Progress towards PT goals: Progressing toward goals    Frequency    7X/week      PT Plan  Co-evaluation              AM-PAC PT "6 Clicks" Mobility   Outcome Measure  Help needed turning from your back to your side while in a flat bed without using bedrails?: None Help needed moving from lying on your back to sitting on the side of a flat bed without using bedrails?: None Help needed moving to and from a bed to a chair (including a wheelchair)?: A Little Help needed standing up from a chair using your arms (e.g., wheelchair or bedside chair)?: A Little Help needed to walk in hospital room?: A Little Help needed climbing 3-5 steps with a railing? : A Little 6 Click Score: 20    End of Session Equipment Utilized During Treatment: Gait belt Activity Tolerance: Patient tolerated treatment well Patient left: in  bed;with call bell/phone within reach;with bed alarm set   PT Visit Diagnosis: Unsteadiness on feet (R26.81);Other abnormalities of gait and mobility (R26.89);Muscle weakness (generalized) (M62.81);Pain;Difficulty in walking, not elsewhere classified (R26.2) Pain - Right/Left: Right Pain - part of body: Knee     Time: 8295-6213 PT Time Calculation (min) (ACUTE ONLY): 20 min  Charges:    $Gait Training: 8-22 mins PT General Charges $$ ACUTE PT VISIT: 1 Visit                        Faye Ramsay, PT Acute Rehabilitation  Office: [phone]

## 2023-12-07 NOTE — Progress Notes (Signed)
 Subjective: 1 Day Post-Op Procedure(s) (LRB): RIGHT TOTAL KNEE ARTHROPLASTY (Right) Patient reports pain as moderate.    Objective: Vital signs in last 24 hours: Temp:  [96 F (35.6 C)-97.8 F (36.6 C)] 97.8 F (36.6 C) (03/08 0926) Pulse Rate:  [48-80] 71 (03/08 0926) Resp:  [12-19] 17 (03/08 0926) BP: (88-138)/(47-90) 121/65 (03/08 0942) SpO2:  [92 %-99 %] 98 % (03/08 0926)  Intake/Output from previous day: 03/07 0701 - 03/08 0700 In: 4907.9 [P.O.:960; I.V.:3497.9; IV Piggyback:450] Out: 900 [Urine:850; Blood:50] Intake/Output this shift: Total I/O In: 240 [P.O.:240] Out: -   Recent Labs    12/07/23 0348  HGB 11.6*   Recent Labs    12/07/23 0348  WBC 15.1*  RBC 3.71*  HCT 38.6*  PLT 242   Recent Labs    12/06/23 1030 12/07/23 0348  NA 138 136  K 4.5 5.5*  CL 108 107  CO2 22 20*  BUN 28* 36*  CREATININE 1.26* 1.46*  GLUCOSE 110* 165*  CALCIUM 8.3* 8.6*   No results for input(s): "LABPT", "INR" in the last 72 hours.  Sensation intact distally Intact pulses distally Dorsiflexion/Plantar flexion intact Incision: dressing C/D/I Compartment soft   Assessment/Plan: 1 Day Post-Op Procedure(s) (LRB): RIGHT TOTAL KNEE ARTHROPLASTY (Right) Up with therapy Plan for discharge tomorrow Discharge home with home health      Kathryne Hitch 12/07/2023, 10:46 AM

## 2023-12-07 NOTE — Care Management Obs Status (Signed)
 MEDICARE OBSERVATION STATUS NOTIFICATION   Patient Details  Name: REEVES MUSICK MRN: [id number] Date of Birth: [birth year]   Medicare Observation Status Notification Given:  Yes    Diona Browner, LCSW 12/07/2023, 3:19 PM

## 2023-12-07 NOTE — Plan of Care (Signed)
  Problem: Clinical Measurements: Goal: Ability to maintain clinical measurements within normal limits will improve Outcome: Progressing   Problem: Nutrition: Goal: Adequate nutrition will be maintained Outcome: Progressing   Problem: Elimination: Goal: Will not experience complications related to bowel motility Outcome: Progressing   Problem: Safety: Goal: Ability to remain free from injury will improve Outcome: Progressing   Problem: Education: Goal: Knowledge of the prescribed therapeutic regimen will improve Outcome: Progressing   Problem: Activity: Goal: Range of joint motion will improve Outcome: Progressing   Problem: Pain Management: Goal: Pain level will decrease with appropriate interventions Outcome: Progressing

## 2023-12-07 NOTE — Progress Notes (Signed)
 Physical Therapy Treatment Patient Details Name: Donald Wood MRN: [id number] DOB: 21-Dec-1955 Today's Date: 12/07/2023   History of Present Illness 68 yo male presents to therapy s/p R TKA on 12/06/2023 due to failure of conservative measures. Pt PMH includes but is not limited to: bradycardia, CAD s/p cath, HLD, STEMI, HTN, and L TKA (2016).    PT Comments  Pt agreeable to working with therapy. Continues to mobilize well. Plan is for d/c home on tomorrow.     If plan is discharge home, recommend the following: A little help with walking and/or transfers;A little help with bathing/dressing/bathroom;Assistance with cooking/housework;Assist for transportation;Help with stairs or ramp for entrance   Can travel by private vehicle        Equipment Recommendations  None recommended by PT    Recommendations for Other Services       Precautions / Restrictions Precautions Precautions: Fall;Knee Restrictions Weight Bearing Restrictions Per Provider Order: No Other Position/Activity Restrictions: WBAT     Mobility  Bed Mobility Overal bed mobility: Needs Assistance Bed Mobility: Supine to Sit, Sit to Supine     Supine to sit: Supervision, HOB elevated Sit to supine: Supervision, HOB elevated   General bed mobility comments: Increased time. Pt helped LE onto bed using his UEs.    Transfers Overall transfer level: Needs assistance Equipment used: Rolling walker (2 wheels) Transfers: Sit to/from Stand Sit to Stand: Supervision           General transfer comment: Cues for safety, hand placement.    Ambulation/Gait Ambulation/Gait assistance: Supervision Gait Distance (Feet): 250 Feet Assistive device: Rolling walker (2 wheels) Gait Pattern/deviations: Step-through pattern, Decreased stride length       General Gait Details: No LOB. Pt tolerated distance well.   Stairs             Wheelchair Mobility     Tilt Bed    Modified Rankin (Stroke Patients Only)        Balance Overall balance assessment: Needs assistance         Standing balance support: During functional activity, Reliant on assistive device for balance Standing balance-Leahy Scale: Fair                              Hotel manager: No apparent difficulties  Cognition Arousal: Alert Behavior During Therapy: WFL for tasks assessed/performed   PT - Cognitive impairments: No apparent impairments                         Following commands: Intact      Cueing    Exercises Total Joint Exercises Ankle Circles/Pumps: AROM, Both, 10 reps Quad Sets: AROM, Right, 10 reps Straight Leg Raises: AROM, Right, 10 reps Knee Flexion: AROM, Right, 10 reps, Seated    General Comments        Pertinent Vitals/Pain Pain Assessment Pain Assessment: Faces Pain Score: 5  Faces Pain Scale: Hurts even more Pain Location: R knee Pain Descriptors / Indicators: Aching, Discomfort Pain Intervention(s): Limited activity within patient's tolerance, Monitored during session, Repositioned, Ice applied    Home Living                          Prior Function            PT Goals (current goals can now be found in the care plan section) Progress towards PT  goals: Progressing toward goals    Frequency    7X/week      PT Plan      Co-evaluation              AM-PAC PT "6 Clicks" Mobility   Outcome Measure  Help needed turning from your back to your side while in a flat bed without using bedrails?: None Help needed moving from lying on your back to sitting on the side of a flat bed without using bedrails?: None Help needed moving to and from a bed to a chair (including a wheelchair)?: None Help needed standing up from a chair using your arms (e.g., wheelchair or bedside chair)?: None Help needed to walk in hospital room?: A Little Help needed climbing 3-5 steps with a railing? : A Little 6 Click Score: 22    End  of Session Equipment Utilized During Treatment: Gait belt Activity Tolerance: Patient tolerated treatment well Patient left: in bed;with call bell/phone within reach;with family/visitor present   PT Visit Diagnosis: Unsteadiness on feet (R26.81);Other abnormalities of gait and mobility (R26.89);Muscle weakness (generalized) (M62.81);Pain;Difficulty in walking, not elsewhere classified (R26.2) Pain - Right/Left: Right Pain - part of body: Knee     Time: 2725-3664 PT Time Calculation (min) (ACUTE ONLY): 15 min  Charges:    $Gait Training: 8-22 mins PT General Charges $$ ACUTE PT VISIT: 1 Visit                         Faye Ramsay, PT Acute Rehabilitation  Office: [phone]

## 2023-12-08 DIAGNOSIS — M1711 Unilateral primary osteoarthritis, right knee: Secondary | ICD-10-CM | POA: Diagnosis not present

## 2023-12-08 NOTE — Progress Notes (Signed)
  Subjective: Patient stable.  Pain controlled.  He has been walking in the room with a walker.  He states that he is ready to get home to his own bed.   Objective: Vital signs in last 24 hours: Temp:  [98.1 F (36.7 C)-98.8 F (37.1 C)] 98.1 F (36.7 C) (03/09 0444) Pulse Rate:  [65-72] 72 (03/09 0444) Resp:  [15-17] 17 (03/09 0444) BP: (100-145)/(51-77) 145/77 (03/09 0806) SpO2:  [96 %-100 %] 100 % (03/09 0444)  Intake/Output from previous day: 03/08 0701 - 03/09 0700 In: 1541.9 [P.O.:900; I.V.:641.9] Out: 800 [Urine:800] Intake/Output this shift: No intake/output data recorded.  Exam:  Sensation intact distally Intact pulses distally Dorsiflexion/Plantar flexion intact  Labs: Recent Labs    12/07/23 0348  HGB 11.6*   Recent Labs    12/07/23 0348  WBC 15.1*  RBC 3.71*  HCT 38.6*  PLT 242   Recent Labs    12/06/23 1030 12/07/23 0348  NA 138 136  K 4.5 5.5*  CL 108 107  CO2 22 20*  BUN 28* 36*  CREATININE 1.26* 1.46*  GLUCOSE 110* 165*  CALCIUM 8.3* 8.6*   No results for input(s): "LABPT", "INR" in the last 72 hours.  Assessment/Plan: Plan at this time is discharge to home after physical therapy this morning.  He is motivated for discharge.   Marrianne Mood Ineze Serrao 12/08/2023, 9:42 AM

## 2023-12-08 NOTE — Progress Notes (Signed)
 Physical Therapy Treatment Patient Details Name: Donald Wood MRN: [id number] DOB: Jan 24, 1956 Today's Date: 12/08/2023   History of Present Illness 68 yo male presents to therapy s/p R TKA on 12/06/2023 due to failure of conservative measures. Pt PMH includes but is not limited to: bradycardia, CAD s/p cath, HLD, STEMI, HTN, and L TKA (2016).    PT Comments  Reviewed/practiced exercises, gait training, and stair training. Encouraged pt to ambulate often at home, as tolerated.  Plan is for HHPT f/u    If plan is discharge home, recommend the following: A little help with walking and/or transfers;A little help with bathing/dressing/bathroom;Assistance with cooking/housework;Assist for transportation;Help with stairs or ramp for entrance   Can travel by private vehicle        Equipment Recommendations  None recommended by PT    Recommendations for Other Services       Precautions / Restrictions Precautions Precautions: Fall;Knee Restrictions Weight Bearing Restrictions Per Provider Order: No Other Position/Activity Restrictions: WBAT     Mobility  Bed Mobility               General bed mobility comments: oob in recliner    Transfers Overall transfer level: Needs assistance Equipment used: Rolling walker (2 wheels) Transfers: Sit to/from Stand Sit to Stand: Supervision                Ambulation/Gait Ambulation/Gait assistance: Supervision Gait Distance (Feet): 100 Feet Assistive device: Rolling walker (2 wheels) Gait Pattern/deviations: Step-through pattern, Antalgic           Stairs Stairs: Yes Stairs assistance: Supervision Stair Management: Two rails, Forwards, Step to pattern   General stair comments: Cues for safety, sequencing. No assist required.   Wheelchair Mobility     Tilt Bed    Modified Rankin (Stroke Patients Only)       Balance Overall balance assessment: Needs assistance         Standing balance support: During  functional activity, Reliant on assistive device for balance Standing balance-Leahy Scale: Fair                              Hotel manager: No apparent difficulties  Cognition Arousal: Alert Behavior During Therapy: WFL for tasks assessed/performed   PT - Cognitive impairments: No apparent impairments                         Following commands: Intact      Cueing    Exercises Total Joint Exercises Quad Sets: AROM, Right, 10 reps Straight Leg Raises: AROM, Right, 10 reps Knee Flexion: AROM, Right, 10 reps, Seated    General Comments        Pertinent Vitals/Pain Pain Assessment Pain Assessment: Faces Faces Pain Scale: Hurts even more Pain Location: R knee Pain Descriptors / Indicators: Aching, Discomfort Pain Intervention(s): Limited activity within patient's tolerance, Monitored during session, Repositioned, Ice applied    Home Living                          Prior Function            PT Goals (current goals can now be found in the care plan section) Progress towards PT goals: Progressing toward goals    Frequency    7X/week      PT Plan      Co-evaluation  AM-PAC PT "6 Clicks" Mobility   Outcome Measure  Help needed turning from your back to your side while in a flat bed without using bedrails?: None Help needed moving from lying on your back to sitting on the side of a flat bed without using bedrails?: None Help needed moving to and from a bed to a chair (including a wheelchair)?: None Help needed standing up from a chair using your arms (e.g., wheelchair or bedside chair)?: None Help needed to walk in hospital room?: A Little Help needed climbing 3-5 steps with a railing? : A Little 6 Click Score: 22    End of Session Equipment Utilized During Treatment: Gait belt Activity Tolerance: Patient tolerated treatment well Patient left: in chair;with call bell/phone within  reach   PT Visit Diagnosis: Unsteadiness on feet (R26.81);Other abnormalities of gait and mobility (R26.89);Muscle weakness (generalized) (M62.81);Pain;Difficulty in walking, not elsewhere classified (R26.2) Pain - Right/Left: Right Pain - part of body: Knee     Time: 1006-1030 PT Time Calculation (min) (ACUTE ONLY): 24 min  Charges:    $Gait Training: 8-22 mins $Therapeutic Exercise: 8-22 mins PT General Charges $$ ACUTE PT VISIT: 1 Visit                         Faye Ramsay, PT Acute Rehabilitation  Office: [phone]

## 2023-12-08 NOTE — TOC Transition Note (Signed)
 Transition of Care [facility]) - Discharge Note   Patient Details  Name: Donald Wood MRN: [id number] Date of Birth: 04/23/1956  [facility] CM/SW Contact:  Epifanio Lesches, RN Phone Number: 12/08/2023, 11:55 AM   Clinical Narrative:    Patient will DC MV:[facility] Anticipated DC date: 12/08/2023 Family notified: yes Transport by: car         - s/p R TKA 3/7  Per MD patient ready for DC today . RN, patient, patient's family, and  [facility] ( prearranged by provider's office) notified of DC.  Pt without DME needs. States has equipment @ home. Pt without RX med concerns  and transportation issues. Wife to provide transportation to home.  Post hospital f/u noted on AVS.  RNCM will sign off for now as intervention is no longer needed. Please consult [place] again if new needs arise.    Final next level of care: Home w Home Health Services Barriers to Discharge: No Barriers Identified   Patient Goals and CMS Choice            Discharge Placement                       Discharge Plan and Services Additional resources added to the After Visit Summary for                                       Social Drivers of Health (SDOH) Interventions SDOH Screenings   Food Insecurity: No Food Insecurity (12/06/2023)  Housing: Low Risk  (12/06/2023)  Transportation Needs: No Transportation Needs (12/06/2023)  Utilities: Not At Risk (12/06/2023)  Depression (PHQ2-9): Low Risk  (07/03/2019)  Tobacco Use: High Risk (12/06/2023)     Readmission Risk Interventions     No data to display

## 2023-12-09 ENCOUNTER — Encounter (HOSPITAL_COMMUNITY): Payer: Self-pay | Admitting: Orthopaedic Surgery

## 2023-12-09 NOTE — Discharge Summary (Signed)
 Patient ID: Donald Wood MRN: [id number] DOB/AGE: 11-30-1955 68 y.o.  Admit date: 12/06/2023 Discharge date: 12/08/23  Admission Diagnoses:  Principal Problem:   Unilateral primary osteoarthritis, right knee Active Problems:   Status post total right knee replacement   Discharge Diagnoses:  Same  Past Medical History:  Diagnosis Date   Arthritis    CAD (coronary artery disease), native coronary artery    a.07/29/15 Inferior infarct Cath showed normal left main, 30% diagonal, 60% distal LAD, 30% circumflex, occluded right coronary artery 4.0 x 38 mm and 4.0 x 16 mm Synergy drug-eluting stents to right coronary artery.   Dysrhythmia    bradycardia   Elevated liver function tests    Hyperglycemia    Hyperlipidemia 07/31/2015   Hypertension    Leukocytosis    noted on labs   Mild carotid artery disease (HCC)    Myocardial infarction [facility])    Obesity    Renal insufficiency    Subclavian artery stenosis (HCC)    a. ? right flow disturbed on carotid duplex 2018.    Surgeries: Procedure(s): RIGHT TOTAL KNEE ARTHROPLASTY on 12/06/2023   Consultants:   Discharged Condition: Improved  Hospital Course: Donald Wood is an 68 y.o. male who was admitted 12/06/2023 for operative treatment ofUnilateral primary osteoarthritis, right knee. Patient has severe unremitting pain that affects sleep, daily activities, and work/hobbies. After pre-op clearance the patient was taken to the operating room on 12/06/2023 and underwent  Procedure(s): RIGHT TOTAL KNEE ARTHROPLASTY.    Patient was given perioperative antibiotics:  Anti-infectives (From admission, onward)    Start     Dose/Rate Route Frequency Ordered Stop   12/06/23 0615  ceFAZolin (ANCEF) IVPB 2g/100 mL premix        2 g 200 mL/hr over 30 Minutes Intravenous On call to O.R. 12/06/23 2952 12/06/23 0856        Patient was given sequential compression devices, early ambulation, and chemoprophylaxis to prevent DVT.  Patient benefited  maximally from hospital stay and there were no complications.    Recent vital signs: No data found.   Recent laboratory studies:  Recent Labs    12/07/23 0348  WBC 15.1*  HGB 11.6*  HCT 38.6*  PLT 242  NA 136  K 5.5*  CL 107  CO2 20*  BUN 36*  CREATININE 1.46*  GLUCOSE 165*  CALCIUM 8.6*     Discharge Medications:   Allergies as of 12/08/2023       Reactions   Metoprolol Other (See Comments)   Pt trialed on metoprolol and carvedilol - bradycardic with HR 40-50 on both beta blockers.        Medication List     TAKE these medications    albuterol 108 (90 Base) MCG/ACT inhaler Commonly known as: VENTOLIN HFA Inhale 2 puffs into the lungs every 6 (six) hours as needed for wheezing or shortness of breath.   amLODipine 10 MG tablet Commonly known as: NORVASC TAKE 1 TABLET(10 MG) BY MOUTH DAILY   atorvastatin 80 MG tablet Commonly known as: LIPITOR Take one (1) tablet by mouth ( 80 mg) every day at 6 pm.   benzonatate 100 MG capsule Commonly known as: TESSALON Take 1 capsule (100 mg total) by mouth 3 (three) times daily as needed.   clopidogrel 75 MG tablet Commonly known as: PLAVIX TAKE 1 TABLET(75 MG) BY MOUTH DAILY   ezetimibe 10 MG tablet Commonly known as: ZETIA Take 1 tablet (10 mg total) by mouth daily.  fluticasone 50 MCG/ACT nasal spray Commonly known as: FLONASE Place 2 sprays into both nostrils daily as needed for allergies.   hydrochlorothiazide 12.5 MG tablet Commonly known as: HYDRODIURIL Take 1 tablet (12.5 mg total) by mouth daily.   lisinopril 40 MG tablet Commonly known as: ZESTRIL Take 1 tablet (40 mg total) by mouth daily.   nitroGLYCERIN 0.4 MG SL tablet Commonly known as: NITROSTAT Place 1 tablet (0.4 mg total) under the tongue every 5 (five) minutes as needed for chest pain.   oxyCODONE 5 MG immediate release tablet Commonly known as: Oxy IR/ROXICODONE Take 1-2 tablets (5-10 mg total) by mouth every 4 (four) hours as  needed for moderate pain (pain score 4-6) (pain score 4-6).   tiZANidine 4 MG tablet Commonly known as: ZANAFLEX Take 1 tablet (4 mg total) by mouth every 6 (six) hours as needed for muscle spasms.        Diagnostic Studies: DG Knee Right Port Result Date: 12/06/2023 CLINICAL DATA:  Right knee replacement EXAM: PORTABLE RIGHT KNEE - 1-2 VIEW COMPARISON:  None Available. FINDINGS: Right knee arthroplasty in expected alignment. No periprosthetic lucency or fracture. Recent postsurgical change includes air and edema in the soft tissues and joint space. Anterior skin staples in place. IMPRESSION: Right knee arthroplasty without immediate postoperative complication. Electronically Signed   By: Narda Rutherford M.D.   On: 12/06/2023 14:54    Disposition: Discharge disposition: 01-Home or Self Care       Discharge Instructions     Call MD / Call 911   Complete by: As directed    If you experience chest pain or shortness of breath, CALL 911 and be transported to the hospital emergency room.  If you develope a fever above 101 F, pus (white drainage) or increased drainage or redness at the wound, or calf pain, call your surgeon's office.   Constipation Prevention   Complete by: As directed    Drink plenty of fluids.  Prune juice may be helpful.  You may use a stool softener, such as Colace (over the counter) 100 mg twice a day.  Use MiraLax (over the counter) for constipation as needed.   Diet - low sodium heart healthy   Complete by: As directed    Increase activity slowly as tolerated   Complete by: As directed    Post-operative opioid taper instructions:   Complete by: As directed    POST-OPERATIVE OPIOID TAPER INSTRUCTIONS: It is important to wean off of your opioid medication as soon as possible. If you do not need pain medication after your surgery it is ok to stop day one. Opioids include: Codeine, Hydrocodone(Norco, Vicodin), Oxycodone(Percocet, oxycontin) and hydromorphone amongst  others.  Long term and even short term use of opiods can cause: Increased pain response Dependence Constipation Depression Respiratory depression And more.  Withdrawal symptoms can include Flu like symptoms Nausea, vomiting And more Techniques to manage these symptoms Hydrate well Eat regular healthy meals Stay active Use relaxation techniques(deep breathing, meditating, yoga) Do Not substitute Alcohol to help with tapering If you have been on opioids for less than two weeks and do not have pain than it is ok to stop all together.  Plan to wean off of opioids This plan should start within one week post op of your joint replacement. Maintain the same interval or time between taking each dose and first decrease the dose.  Cut the total daily intake of opioids by one tablet each day Next start to increase the time  between doses. The last dose that should be eliminated is the evening dose.           Follow-up Information     Kathryne Hitch, MD Follow up in 2 week(s).   Specialty: Orthopedic Surgery Contact information: [address] [place] [address] [phone]         [facility] Follow up.   Specialty: Home Health Services Why: home health services will be provided by [facility], start of care within 48 hours post discharge Contact information: [address] [place] [address] [place] [address] [phone]                  Signed: Richardean Canal 12/09/2023, 4:56 PM

## 2023-12-19 ENCOUNTER — Ambulatory Visit: Payer: Medicare Other | Admitting: Orthopaedic Surgery

## 2023-12-19 ENCOUNTER — Encounter: Payer: Self-pay | Admitting: Orthopaedic Surgery

## 2023-12-19 DIAGNOSIS — Z96651 Presence of right artificial knee joint: Secondary | ICD-10-CM

## 2023-12-19 MED ORDER — OXYCODONE HCL 5 MG PO TABS
5.0000 mg | ORAL_TABLET | ORAL | 0 refills | Status: DC | PRN
Start: 2023-12-19 — End: 2024-01-07

## 2023-12-19 NOTE — Progress Notes (Signed)
 The patient is a 68 year old gentleman who is here for his first postoperative visit status post a right total knee arthroplasty.  He is on Plavix from a blood thinning medication standpoint.  He does need a refill of oxycodone.  He is already walking without an assistive device.  Home therapy is pushing hard.  On my exam his extension is almost full and his flexion is to 90 degrees.  The staples are removed and Steri-Strips applied.  His calf is soft.  I will send in some more oxycodone for him.  I did give him a prescription for outpatient physical therapy at [facility] physical therapy which she will call to get established.  Will see him back in 4 weeks to see how he is doing overall.  No x-rays are needed.

## 2024-01-07 ENCOUNTER — Telehealth: Payer: Self-pay | Admitting: Orthopaedic Surgery

## 2024-01-07 ENCOUNTER — Other Ambulatory Visit: Payer: Self-pay | Admitting: Orthopaedic Surgery

## 2024-01-07 MED ORDER — OXYCODONE HCL 5 MG PO TABS
5.0000 mg | ORAL_TABLET | Freq: Four times a day (QID) | ORAL | 0 refills | Status: DC | PRN
Start: 2024-01-07 — End: 2024-01-16

## 2024-01-07 NOTE — Telephone Encounter (Signed)
 Rx refill Oxycodone   [facility]/ [place] [address]

## 2024-01-16 ENCOUNTER — Other Ambulatory Visit (INDEPENDENT_AMBULATORY_CARE_PROVIDER_SITE_OTHER)

## 2024-01-16 ENCOUNTER — Ambulatory Visit (INDEPENDENT_AMBULATORY_CARE_PROVIDER_SITE_OTHER): Admitting: Orthopaedic Surgery

## 2024-01-16 ENCOUNTER — Encounter: Payer: Self-pay | Admitting: Orthopaedic Surgery

## 2024-01-16 DIAGNOSIS — Z96651 Presence of right artificial knee joint: Secondary | ICD-10-CM | POA: Diagnosis not present

## 2024-01-16 MED ORDER — HYDROCODONE-ACETAMINOPHEN 5-325 MG PO TABS: 1.0000 | ORAL_TABLET | Freq: Four times a day (QID) | ORAL | 0 refills | Status: AC | PRN

## 2024-01-16 NOTE — Progress Notes (Signed)
 The patient is a [age]-year-old gentleman who is now 6 weeks status post a right total knee replacement.  We replaced his left knee back in 2016.  He never went to any outpatient therapy because he was doing on his own mainly doing yard work and [employer] he stated.  He is walk without assistive device and no significant limp.  On exam his left knee from years ago has full range of motion and no swelling and no effusion and is ligamentously stable.  His more recent right operative knee has some swelling as expected.  His extension is full and I can flex him the past 90 degrees.  He feels stable as well.  X-rays of the right knee today show well-seated press-fit total knee arthroplasty with no complicating features.  He is doing so well from my standpoint we will see him back in 6 months with a standing AP and lateral of the right knee.  I will send in some hydrocodone now instead of oxycodone to try to start weaning him from narcotics.

## 2024-04-15 ENCOUNTER — Other Ambulatory Visit: Payer: Self-pay | Admitting: Cardiovascular Disease

## 2024-04-18 ENCOUNTER — Other Ambulatory Visit: Payer: Self-pay | Admitting: Cardiovascular Disease

## 2024-04-22 ENCOUNTER — Other Ambulatory Visit: Payer: Self-pay | Admitting: Cardiovascular Disease

## 2024-07-20 ENCOUNTER — Ambulatory Visit: Admitting: Orthopaedic Surgery

## 2024-10-13 LAB — LAB REPORT - SCANNED
Albumin, Urine POC: 1
Creatinine, POC: 160 mg/dL
EGFR: 80
HM Hepatitis Screen: NEGATIVE
Microalb Creat Ratio: 6
PSA, Total: 1.08
